# Patient Record
Sex: Female | Born: 1960 | Race: Black or African American | Hispanic: No | Marital: Single | Smoking: Never smoker
Health system: Southern US, Community
[De-identification: ages and names within clinical notes are randomized; demographics above are authoritative.]

## PROBLEM LIST (undated history)

## (undated) DIAGNOSIS — I471 Supraventricular tachycardia, unspecified: Secondary | ICD-10-CM

## (undated) DIAGNOSIS — M1990 Unspecified osteoarthritis, unspecified site: Secondary | ICD-10-CM

## (undated) DIAGNOSIS — I10 Essential (primary) hypertension: Secondary | ICD-10-CM

## (undated) HISTORY — DX: Supraventricular tachycardia: I47.1

## (undated) HISTORY — PX: KNEE SURGERY: SHX244

## (undated) HISTORY — DX: Supraventricular tachycardia, unspecified: I47.10

## (undated) HISTORY — DX: Essential (primary) hypertension: I10

---

## 2005-08-31 ENCOUNTER — Emergency Department (HOSPITAL_COMMUNITY): Admission: EM | Admit: 2005-08-31 | Discharge: 2005-08-31 | Payer: Self-pay | Admitting: Emergency Medicine

## 2005-10-07 ENCOUNTER — Ambulatory Visit: Payer: Self-pay | Admitting: Internal Medicine

## 2005-11-05 ENCOUNTER — Ambulatory Visit: Payer: Self-pay | Admitting: Internal Medicine

## 2005-11-09 ENCOUNTER — Ambulatory Visit: Payer: Self-pay | Admitting: Internal Medicine

## 2005-11-17 ENCOUNTER — Ambulatory Visit: Payer: Self-pay | Admitting: Endocrinology

## 2005-11-17 ENCOUNTER — Ambulatory Visit: Payer: Self-pay | Admitting: Internal Medicine

## 2005-12-02 ENCOUNTER — Ambulatory Visit: Payer: Self-pay | Admitting: Internal Medicine

## 2005-12-21 ENCOUNTER — Ambulatory Visit: Payer: Self-pay | Admitting: Internal Medicine

## 2006-01-12 ENCOUNTER — Encounter: Admission: RE | Admit: 2006-01-12 | Discharge: 2006-01-12 | Payer: Self-pay | Admitting: Internal Medicine

## 2006-02-17 ENCOUNTER — Ambulatory Visit: Payer: Self-pay | Admitting: Internal Medicine

## 2006-03-17 ENCOUNTER — Ambulatory Visit: Payer: Self-pay | Admitting: Internal Medicine

## 2007-03-10 ENCOUNTER — Encounter: Payer: Self-pay | Admitting: *Deleted

## 2007-03-10 DIAGNOSIS — I10 Essential (primary) hypertension: Secondary | ICD-10-CM | POA: Insufficient documentation

## 2007-03-10 DIAGNOSIS — E669 Obesity, unspecified: Secondary | ICD-10-CM | POA: Insufficient documentation

## 2008-04-01 ENCOUNTER — Ambulatory Visit: Payer: Self-pay | Admitting: Internal Medicine

## 2008-04-03 ENCOUNTER — Ambulatory Visit: Payer: Self-pay

## 2008-04-03 ENCOUNTER — Encounter: Payer: Self-pay | Admitting: Internal Medicine

## 2008-04-04 ENCOUNTER — Ambulatory Visit: Payer: Self-pay

## 2009-05-20 ENCOUNTER — Encounter: Admission: RE | Admit: 2009-05-20 | Discharge: 2009-05-20 | Payer: Self-pay | Admitting: Internal Medicine

## 2010-02-11 ENCOUNTER — Telehealth: Payer: Self-pay | Admitting: Internal Medicine

## 2010-02-11 ENCOUNTER — Emergency Department (HOSPITAL_COMMUNITY): Admission: EM | Admit: 2010-02-11 | Discharge: 2010-02-11 | Payer: Self-pay | Admitting: Emergency Medicine

## 2010-02-26 ENCOUNTER — Ambulatory Visit: Payer: Self-pay | Admitting: Internal Medicine

## 2010-02-26 DIAGNOSIS — I471 Supraventricular tachycardia: Secondary | ICD-10-CM | POA: Insufficient documentation

## 2010-07-06 ENCOUNTER — Other Ambulatory Visit: Payer: Self-pay | Admitting: Internal Medicine

## 2010-07-06 DIAGNOSIS — Z1239 Encounter for other screening for malignant neoplasm of breast: Secondary | ICD-10-CM

## 2010-07-09 ENCOUNTER — Ambulatory Visit: Payer: Self-pay

## 2010-07-09 NOTE — Assessment & Plan Note (Signed)
Summary: eph/tia  Medications Added CARDIZEM 120 MG TABS (DILTIAZEM HCL) two times a day      Allergies Added: NKDA  Visit Type:  Follow-up Primary Provider:  Dondra Spry DO   History of Present Illness: Nicole Frazier is seen following another episode of SVT and is referred from the emergency room.  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 She also is modestly hypertensive.  She had another episode last week again which terminated with adenosine.  Symptoms associated with it include shortness of breath and frogpositive. There is no associated chest pain.  she does not snore or have daytime somnolence.  Hospital records were reviewed     Current Medications (verified): 1)  None  Allergies (verified): No Known Drug Allergies  Past History:  Past Medical History: Last updated: 03/10/2007 Hypertension  Vital Signs:  Patient profile:   50 year old female Height:      66 inches Weight:      284 pounds BMI:     46.00 Pulse rate:   64 / minute BP sitting:   160 / 112  (left arm)  Vitals Entered By: Laurance Flatten CMA (February 26, 2010 4:26 PM)  Physical Exam  General:  The patient was alert and oriented in no acute distress. HEENT Normal.  Neck veins were flat, carotids were brisk.  Lungs were clear.  Heart sounds were regular without murmurs or gallops.  Abdomen was soft with active bowel sounds. There is no clubbing cyanosis or edema. 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 Treatment options would include catheter ablation p.r.n. medication ordered daily medication. Given her significant hypertension we will begin her on a calcium  blocker. I reviewed with her the potential side effects. She will be seeing Dr. Cathie Hoops next week who can further up titrated medications as her heart rate allows.  Problem # 2:  HYPERTENSION (ICD-401.9)  as above will begin her on Cardizem. Will start on 120 b.i.d. as I'm concerned about whether her heart rate will tolerate 240.  The following medications were removed from the medication list:    Benazepril-hydrochlorothiazide 20-12.5 Mg Tabs (Benazepril-hydrochlorothiazide) .Marland Kitchen... Take 1 by mouth qd    Cardizem Cd 240 Mg Cp24 (Diltiazem hcl coated beads) .Marland Kitchen... Take 1 by mouth qd Her updated medication list for this problem includes:    Cardizem 120 Mg Tabs (Diltiazem hcl) .Marland Kitchen..Marland Kitchen Two times a day  Patient Instructions: 1)  Your physician has recommended you make the following change in your medication: Cardizem 120mg . one pill in the morning and one in the evening. 2)  Your physician wants you to follow-up in: 1 year  You will receive a reminder letter in the mail two months in advance. If you don't receive a letter, please call our office to schedule the follow-up appointment. Prescriptions: CARDIZEM 120 MG TABS (DILTIAZEM HCL) two times a day  #60 x 3   Entered by:   Claris Gladden RN   Authorized by:   Nathen May, MD, [facility]   Signed by:   Claris Gladden RN on 02/26/2010   Method used:   Electronically to  [facility] [address] [phone]* (retail)       [address]       [place], [place]  [address]       Ph: [phone] or [phone]       Fax: [phone]   RxID:   [id number]

## 2010-07-09 NOTE — Progress Notes (Signed)
Summary: re appt this week   Phone Note Call from Patient   Caller: Patient Reason for Call: Talk to Nurse Summary of Call: pt being dc from hospital now for svt-said hospital wants her to be seen this week with dr Karene Fry this something that has to be seen this week?-pls call [phone] Initial call taken by: Glynda Jaeger,  February 11, 2010 9:35 AM  Follow-up for Phone Call        [facility] Scherrie Bateman, LPN  February 11, 2010 5:07 PM  [facility] Scherrie Bateman, LPN  February 17, 2010 10:46 AM   Additional Follow-up for Phone Call Additional follow up Details #1::        pt returned call-pls call Glynda Jaeger  February 17, 2010 3:18 PM    Additional Follow-up for Phone Call Additional follow up Details #2::    spoke w/pt and she stated she had another SVT episode last Tuesday and Wed at [facility]. See an ED visit on 9/7. Transferred her to make an appointment to discuss w/Dr. Graciela Husbands. Claris Gladden, RN

## 2010-08-20 LAB — POCT CARDIAC MARKERS
CKMB, poc: <1 ng/mL — ABNORMAL LOW (ref 1.0–8.0)
Myoglobin, poc: 39.9 ng/mL (ref 12–200)
Myoglobin, poc: 41.7 ng/mL (ref 12–200)
Troponin i, poc: <0.05 ng/mL (ref 0.00–0.09)

## 2010-08-20 LAB — BASIC METABOLIC PANEL
Calcium: 9 mg/dL (ref 8.4–10.5)
Creatinine, Ser: 0.88 mg/dL (ref 0.4–1.2)
GFR calc Af Amer: >60 mL/min (ref >60)
Sodium: 137 mEq/L (ref 135–145)

## 2010-08-20 LAB — DIFFERENTIAL
Lymphocytes Relative: 31 % (ref 12–46)
Lymphs Abs: 3.8 10*3/uL (ref 0.7–4.0)
Neutro Abs: 6.5 10*3/uL (ref 1.7–7.7)
Neutrophils Relative %: 54 % (ref 43–77)

## 2010-08-20 LAB — CBC
Hemoglobin: 12.5 g/dL (ref 12.0–15.0)
Platelets: 489 10*3/uL — ABNORMAL HIGH (ref 150–400)
RBC: 5.19 MIL/uL — ABNORMAL HIGH (ref 3.87–5.11)
WBC: 12 10*3/uL — ABNORMAL HIGH (ref 4.0–10.5)

## 2010-08-20 LAB — POCT I-STAT, CHEM 8
BUN: 9 mg/dL (ref 6–23)
Chloride: 105 mEq/L (ref 96–112)
Creatinine, Ser: 0.8 mg/dL (ref 0.4–1.2)
Hemoglobin: 14.6 g/dL (ref 12.0–15.0)
Potassium: 4.3 mEq/L (ref 3.5–5.1)
Sodium: 139 mEq/L (ref 135–145)

## 2010-09-08 ENCOUNTER — Other Ambulatory Visit: Payer: Self-pay | Admitting: Internal Medicine

## 2010-10-20 NOTE — Letter (Signed)
April 01, 2008    Nicole Frazier. Nicole Boxer, MD  [facility]  [address]  [place], [place] [address]   [name], [name]  MRN:  [id number]  /  DOB:  [birth year]   Dear Nicole Frazier,   Nicole Frazier was seen today at your request to establish cardiac clearance  for knee replacement procedure.   As you know, she is a 50 year old woman who has prior knee surgery with  long-standing history of poorly controlled high blood pressure, which  extends back at least a couple of years and has an MAR that includes  Metformin, although she does not carry a diagnosis of diabetes and for  which no hemoglobin A1c chart available.   I have met her a couple of years ago when she presented with adenosine  responsive FROG positive supraventricular tachycardia and that has been  quiet in the interim.   Her exercise tolerance is largely limited by her knee.  She used to get  short of breath climbing a flight of stairs.  She does not have  orthopnea, nocturnal dyspnea, or peripheral edema.  She has had no  syncope.   She also denies exertional chest discomfort or exertional upper  extremity discomfort.  She denies palpitations (__________).   PAST MEDICAL HISTORY:  Notable for;  1. Anemia, iron deficiency for which she is taking iron.  2. Arthritis.  3. Hypertension.   REVIEW OF SYSTEMS:  Relatively negative except for the above, apart from  glasses.  Her lipid status is quite good with a mildly low HDL, but the  LDL of 100, and triglyceride level up to 100.   FAMILY HISTORY:  Notable for the absence of diabetes.   SOCIAL HISTORY:  She reports she has 1 son with whom she lives.  He  snores, but she does not think that she does.  She does not use  cigarettes, alcohol or recreational drugs.   MEDICATIONS:  Currently are none.   ALLERGIES:  No known drug allergies.   PHYSICAL EXAMINATION:  GENERAL:  She is a middle-aged, African American  female, who appears her stated age of 63.  VITAL SIGNS:  Her blood pressure is 146/108, her pulse is 94, her weight  is 306, which is relatively stable over the last 2 years.  HEENT:  Demonstrated no icterus or xanthomata.  NECK:  Her neck veins are flat. The carotids are brisk and full  bilaterally without bruits.  BACK:  Without kyphosis or scoliosis.  LUNGS:  Clear.  HEART:  Sounds are irregular, which she was unaware.  Early systolic  murmur was noted along the left sternal border.  ABDOMEN:  Soft with active bowel sounds, but no midline pulsation.  EXTREMITIES:  Femoral pulses are 2+.  Distal pulses are intact. There is  no clubbing, cyanosis, or edema.  NEUROLOGIC:  Grossly normal.  SKIN:  Warm and dry.   Electrocardiogram today demonstrated no acute changes.  I have misplaced  that the heart rate was 95 and there were occasional PVCs on it, as I  said,that she did not sense.   IMPRESSION:  1. Impending knee surgery.  2. 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  3. Hypertension - poorly controlled, on no medication.  4. Obesity.  Body habitus suggestive, but no symptoms of obstructive      sleep apnea.  5. Modest dyspnea on exertion.   Nicole Frazier has got significant hypertension  and this unresolved issue  about diabetes and some exercise limitation as we are getting ready for  her knee surgery.   I would recommend for them to take Myoview scanning to look at both left  ventricular function and myocardial perfusion to hopefully minimize any  periprocedural risks.   MEDICATIONS:  I have given her a prescription for atenolol 50 mg a day  to take 5 days prior to her surgery.  She is to take it also 10 days if  she goes back on her oral therapy and beta-blocker to be used  perioperatively to minimize the recurrence of her supraventricular  tachycardia.   Please let us know when she is in the hospital.  We plan to follow along  with you.     Sincerely,      Duke Salvia, MD, [facility]  Electronically Signed    SCK/MedQ  DD: 04/01/2008  DT: 04/02/2008  Job #: [id number]

## 2010-12-08 ENCOUNTER — Inpatient Hospital Stay (INDEPENDENT_AMBULATORY_CARE_PROVIDER_SITE_OTHER)
Admission: RE | Admit: 2010-12-08 | Discharge: 2010-12-08 | Disposition: A | Discharge status: Home / Self Care | Payer: BC Managed Care – PPO | Source: Ambulatory Visit | Attending: Family Medicine | Admitting: Family Medicine

## 2010-12-08 DIAGNOSIS — M79609 Pain in unspecified limb: Secondary | ICD-10-CM

## 2011-03-22 ENCOUNTER — Encounter: Payer: Self-pay | Admitting: Internal Medicine

## 2011-03-23 ENCOUNTER — Ambulatory Visit: Payer: BC Managed Care – PPO | Admitting: Internal Medicine

## 2011-04-15 ENCOUNTER — Emergency Department (HOSPITAL_COMMUNITY): Payer: BC Managed Care – PPO

## 2011-04-15 ENCOUNTER — Other Ambulatory Visit: Payer: Self-pay

## 2011-04-15 ENCOUNTER — Emergency Department (HOSPITAL_COMMUNITY)
Admission: EM | Admit: 2011-04-15 | Discharge: 2011-04-15 | Disposition: A | Discharge status: Home / Self Care | Payer: BC Managed Care – PPO | Attending: Emergency Medicine | Admitting: Emergency Medicine

## 2011-04-15 ENCOUNTER — Encounter (HOSPITAL_COMMUNITY): Payer: Self-pay | Admitting: Emergency Medicine

## 2011-04-15 DIAGNOSIS — I498 Other specified cardiac arrhythmias: Secondary | ICD-10-CM | POA: Insufficient documentation

## 2011-04-15 DIAGNOSIS — I471 Supraventricular tachycardia: Secondary | ICD-10-CM

## 2011-04-15 DIAGNOSIS — R079 Chest pain, unspecified: Secondary | ICD-10-CM | POA: Insufficient documentation

## 2011-04-15 DIAGNOSIS — R002 Palpitations: Secondary | ICD-10-CM | POA: Insufficient documentation

## 2011-04-15 LAB — CBC
HCT: 42.3 % (ref 36.0–46.0)
Hemoglobin: 13.7 g/dL (ref 12.0–15.0)
MCH: 26 pg (ref 26.0–34.0)
MCHC: 32.4 g/dL (ref 30.0–36.0)
MCV: 80.4 fL (ref 78.0–100.0)
Platelets: 352 10*3/uL (ref 150–400)
RBC: 5.26 MIL/uL — ABNORMAL HIGH (ref 3.87–5.11)
RDW: 14.7 % (ref 11.5–15.5)
WBC: 8 10*3/uL (ref 4.0–10.5)

## 2011-04-15 LAB — CALCIUM, IONIZED: Calcium, Ion: 1.2 mmol/L (ref 1.12–1.32)

## 2011-04-15 LAB — BASIC METABOLIC PANEL
BUN: 12 mg/dL (ref 6–23)
CO2: 24 mEq/L (ref 19–32)
Calcium: 9.1 mg/dL (ref 8.4–10.5)
Chloride: 103 mEq/L (ref 96–112)
Creatinine, Ser: 0.7 mg/dL (ref 0.50–1.10)
GFR calc Af Amer: >90 mL/min (ref >90)
GFR calc non Af Amer: >90 mL/min (ref >90)
Glucose, Bld: 91 mg/dL (ref 70–99)
Potassium: 3.6 mEq/L (ref 3.5–5.1)
Sodium: 138 mEq/L (ref 135–145)

## 2011-04-15 LAB — MAGNESIUM: Magnesium: 1.9 mg/dL (ref 1.5–2.5)

## 2011-04-15 LAB — TROPONIN I: Troponin I: <0.3 ng/mL (ref <0.30)

## 2011-04-15 MED ORDER — ADENOSINE 6 MG/2ML IV SOLN
INTRAVENOUS | Status: AC
  Administered 2011-04-15: 6 mg via INTRAVENOUS
  Filled 2011-04-15: qty 6

## 2011-04-15 MED ORDER — ADENOSINE 6 MG/2ML IV SOLN: 6.0000 mg | Freq: Once | INTRAVENOUS | Status: DC

## 2011-04-15 MED ORDER — ADENOSINE 6 MG/2ML IV SOLN: 6.0000 mg | Freq: Once | INTRAVENOUS | Status: AC

## 2011-04-15 MED ORDER — SODIUM CHLORIDE 0.9 % IV BOLUS (SEPSIS)
1000.0000 mL | Freq: Once | INTRAVENOUS | Status: AC
  Administered 2011-04-15: 1000 mL via INTRAVENOUS

## 2011-04-15 NOTE — ED Notes (Signed)
Urine collected from patient. Was told by dr. Juleen China urine sample not needed.

## 2011-04-15 NOTE — ED Notes (Signed)
Felt her heart started to beat fast at 6 am  No n/v/sob

## 2011-04-15 NOTE — ED Notes (Signed)
Dr. Juleen China aware of patients elevated heart rate and blood pressure.

## 2011-04-15 NOTE — ED Notes (Addendum)
ekg done and given to dr. Juleen China.

## 2011-04-15 NOTE — ED Provider Notes (Signed)
History     50yF with palpitations. Onset around 0600 today. Feels like heart is racing. No CP or SOB. No dizziness, lightheadedness or nausea. No fever or chills. Denies hx of blood clot. No unusual swelling or leg pain. Reports compliance with medications. Hx of SVT with last episode about 2y ago. Followed by Graciela Husbands, cardiology.   CSN: [id number] Arrival date & time: 04/15/2011  7:39 AM   First MD Initiated Contact with Patient 04/15/11 0759      Chief Complaint  Patient presents with  . Chest Pain    (Consider location/radiation/quality/duration/timing/severity/associated sxs/prior treatment) HPI  Past Medical History  Diagnosis Date  . Hypertension   . SVT (supraventricular tachycardia)     recurrent    Past Surgical History  Procedure Date  . Knee surgery     Family History  Problem Relation Age of Onset  . Diabetes Neg Hx     History  Substance Use Topics  . Smoking status: Never Smoker   . Smokeless tobacco: Never Used  . Alcohol Use: No    OB History    Grav Para Term Preterm Abortions TAB SAB Ect Mult Living                  Review of Systems   Review of symptoms negative unless otherwise noted in HPI.   Allergies  Review of patient's allergies indicates no known allergies.  Home Medications   Current Outpatient Rx  Name Route Sig Dispense Refill  . DILTIAZEM HCL 120 MG PO TABS  TAKE 1 TABLET BY MOUTH TWICE DAILY 60 tablet 2    BP 166/124  Temp 98.3 F (36.8 C)  Resp 20  SpO2 98%  Physical Exam  Nursing note and vitals reviewed. Constitutional: She appears well-developed and well-nourished. No distress.  HENT:  Head: Normocephalic and atraumatic.  Eyes: Conjunctivae are normal. Right eye exhibits no discharge. Left eye exhibits no discharge.  Neck: Neck supple.  Cardiovascular: Normal heart sounds.  Exam reveals no gallop and no friction rub.   No murmur heard.      Markedly tachycardic  Pulmonary/Chest: Effort normal and breath  sounds normal. No respiratory distress.  Abdominal: Soft. She exhibits no distension. There is no tenderness.  Musculoskeletal: She exhibits no edema and no tenderness.  Neurological: She is alert.  Skin: Skin is warm and dry. She is not diaphoretic.  Psychiatric: She has a normal mood and affect. Her behavior is normal. Thought content normal.    ED Course  Procedures (including critical care time)  Labs Reviewed  CBC - Abnormal; Notable for the following:    RBC 5.26 (*)    All other components within normal limits  MAGNESIUM  BASIC METABOLIC PANEL  TROPONIN I  CALCIUM, IONIZED    Dg Chest 2 View  04/15/2011  *RADIOLOGY REPORT*  Clinical Data: Tachycardia.  CHEST - 2 VIEW  Comparison: None.  Findings: Two views of the chest demonstrate clear lungs.  Trachea is midline.  Heart and mediastinum are within normal limits.  Bony structures are intact.  IMPRESSION: Normal chest examination.  Original Report Authenticated By: Richarda Overlie, M.D.    No results found.  EKG:  Rhythm: narrow complex tachycardia, likely SVT. No flutter waves appreciated. Rate: 149 Axis: Left Intervals: indeterminant ST segments: NS ST changes   8:08 AM EKG: Repeat after adenosine Rhythm: normal sinus  Axis: L axis Rate: 96 Intervals: first degree AV block, LVH ST segments: NS ST changes Comparison: No significant changes  from previous EKG 02/2010   9:24 AM Pt reassessed. Remains with no complaints. Sinus rhythm on monitor in 80s. Cardiology paged to discuss. Anticipate DC home with cards f/u.  1. SVT (supraventricular tachycardia)       MDM  50yF with SVT. Conversion to normal sinus after adenosine. Hx of same. W/u unremarkable. Pt has remained in normal sinus since cardioversion and remains with no complaints. Outpt f/u with cardiology.        Raeford Razor, MD 04/15/11 [phone]

## 2011-04-15 NOTE — ED Notes (Signed)
bp-159/102

## 2011-04-27 ENCOUNTER — Encounter: Payer: Self-pay | Admitting: Internal Medicine

## 2011-04-27 ENCOUNTER — Ambulatory Visit (INDEPENDENT_AMBULATORY_CARE_PROVIDER_SITE_OTHER): Payer: BC Managed Care – PPO | Admitting: Internal Medicine

## 2011-04-27 VITALS — BP 182/112 | HR 68 | Ht 66.0 in | Wt 300.4 lb

## 2011-04-27 DIAGNOSIS — I10 Essential (primary) hypertension: Secondary | ICD-10-CM

## 2011-04-27 DIAGNOSIS — I498 Other specified cardiac arrhythmias: Secondary | ICD-10-CM

## 2011-04-27 DIAGNOSIS — I471 Supraventricular tachycardia: Secondary | ICD-10-CM

## 2011-04-27 MED ORDER — DILTIAZEM HCL 120 MG PO TABS: 120.0000 mg | ORAL_TABLET | Freq: Two times a day (BID) | ORAL | Status: DC

## 2011-04-27 NOTE — Patient Instructions (Signed)
We will see you back on an as needed basis.  Your physician recommends that you continue on your current medications as directed. Please refer to the Current Medication list given to you today.  

## 2011-04-27 NOTE — Assessment & Plan Note (Signed)
Blood pressure is out of control. She has not been taking her medications. We had a long discussion regarding the importance of antihypertensive therapy. We also discussed his relationship with obesity which is a lifelong struggles for her. We'll resume diltiazem. Will also use adjunct with rhythm control.

## 2011-04-27 NOTE — Assessment & Plan Note (Signed)
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 We reviewed ablation with potential risks and benefits. This point she would like to hold off and pursue medical therapy.

## 2011-04-27 NOTE — Progress Notes (Signed)
  HPI  Nicole Frazier is a 50 y.o. female is seen following another episode of SVT and is referred from the emergency room.  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 She had another episode 2 weeks or so ago Electro-cardiograms from [facility] were reviewed. They are consistent also with AV nodal reentry.  She also had positive hypertension. At her last visit we put her on a calcium blocker with expected up titration..   She ran out of 'Dilt a few months ago and has not been renewed      Past Medical History  Diagnosis Date  . Hypertension   . SVT (supraventricular tachycardia)     recurrent    Past Surgical History  Procedure Date  . Knee surgery     Current Outpatient Prescriptions  Medication Sig Dispense Refill  . diltiazem (CARDIZEM) 120 MG tablet TAKE 1 TABLET BY MOUTH TWICE DAILY  60 tablet  2    No Known Allergies  Review of Systems negative except from HPI and PMH  Physical Exam Well developed and well nourished morbidly obeese in no acute distress HENT normal E scleral and icterus clear Neck Supple JVP flat; carotids brisk and full Clear to ausculation Regular rate and rhythm, no murmurs gallops or rub Soft with active bowel sounds No clubbing cyanosis and edema Alert and oriented, grossly normal motor and sensory function Skin Warm and Dry  ECG  Assessment and  Plan

## 2012-10-04 IMAGING — CR DG CHEST 2V
2 series · 2 of 2 positions shown · non-contrast
Comparison: None.

CLINICAL DATA: Tachycardia.

CHEST - 2 VIEW

[w chest pa]
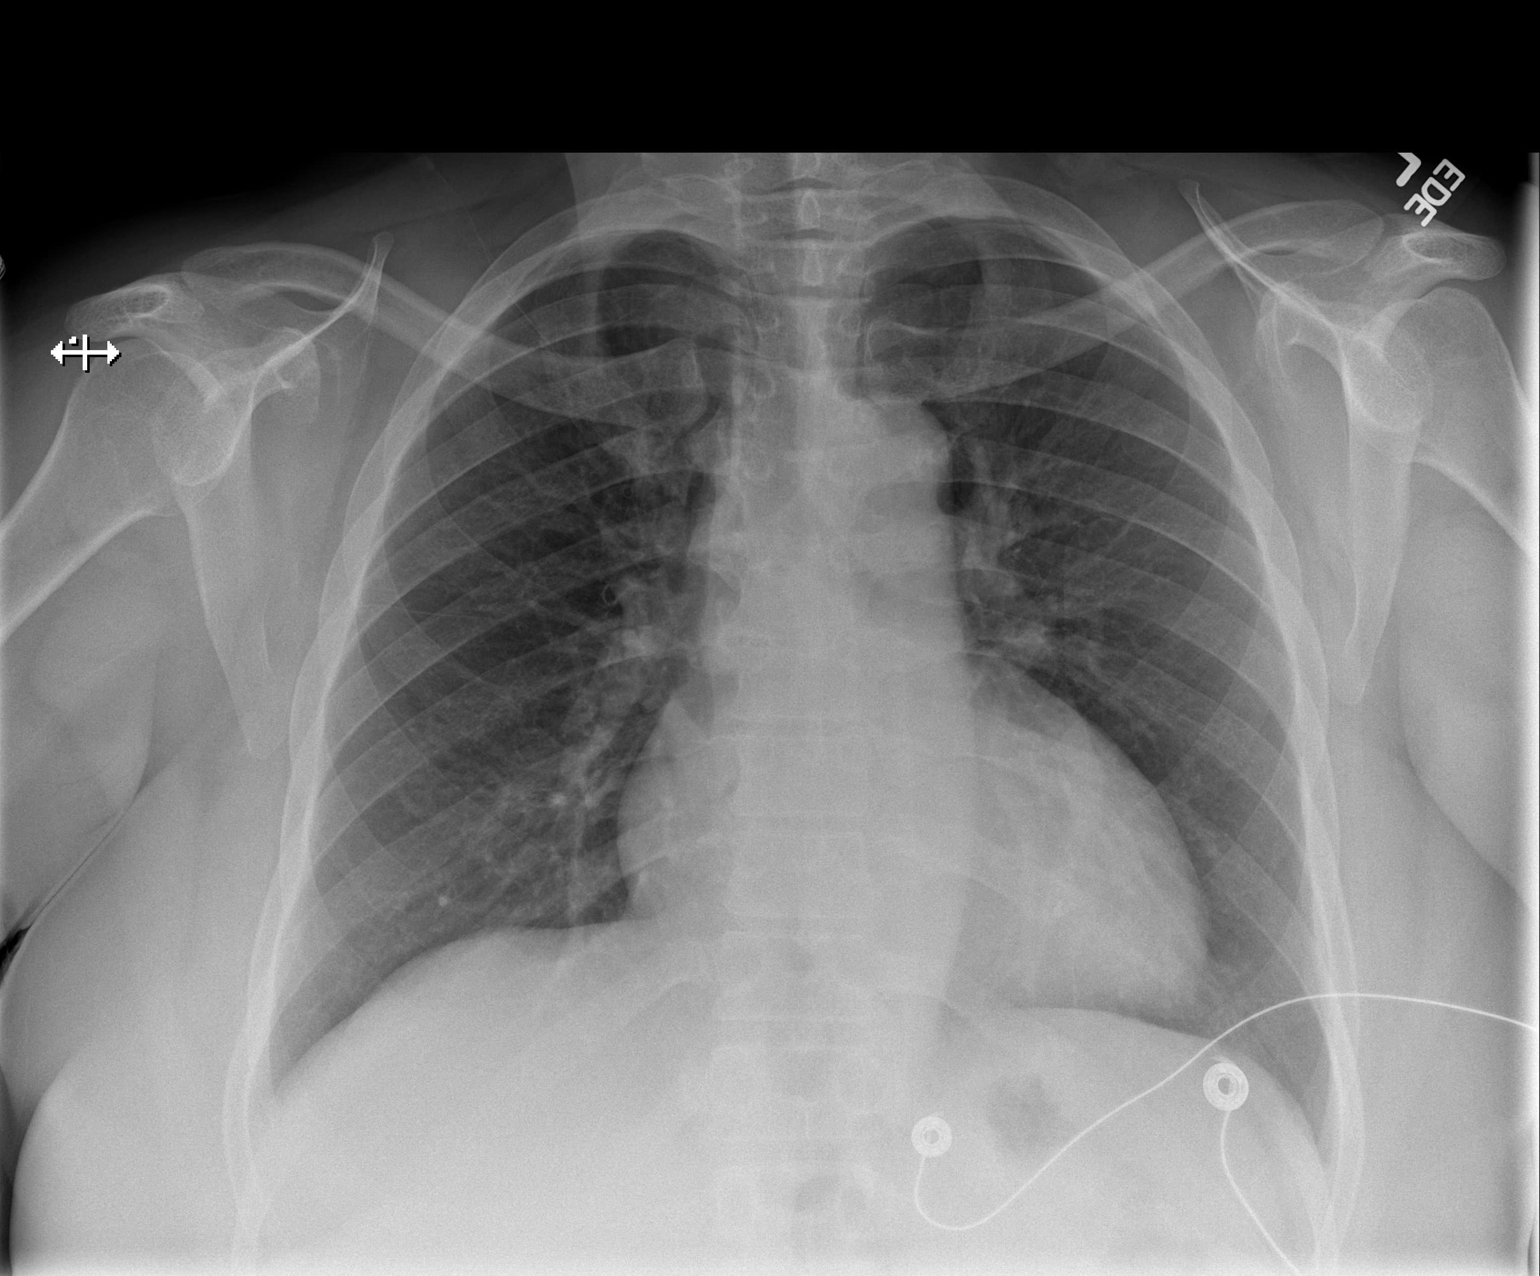

[w chest lat]
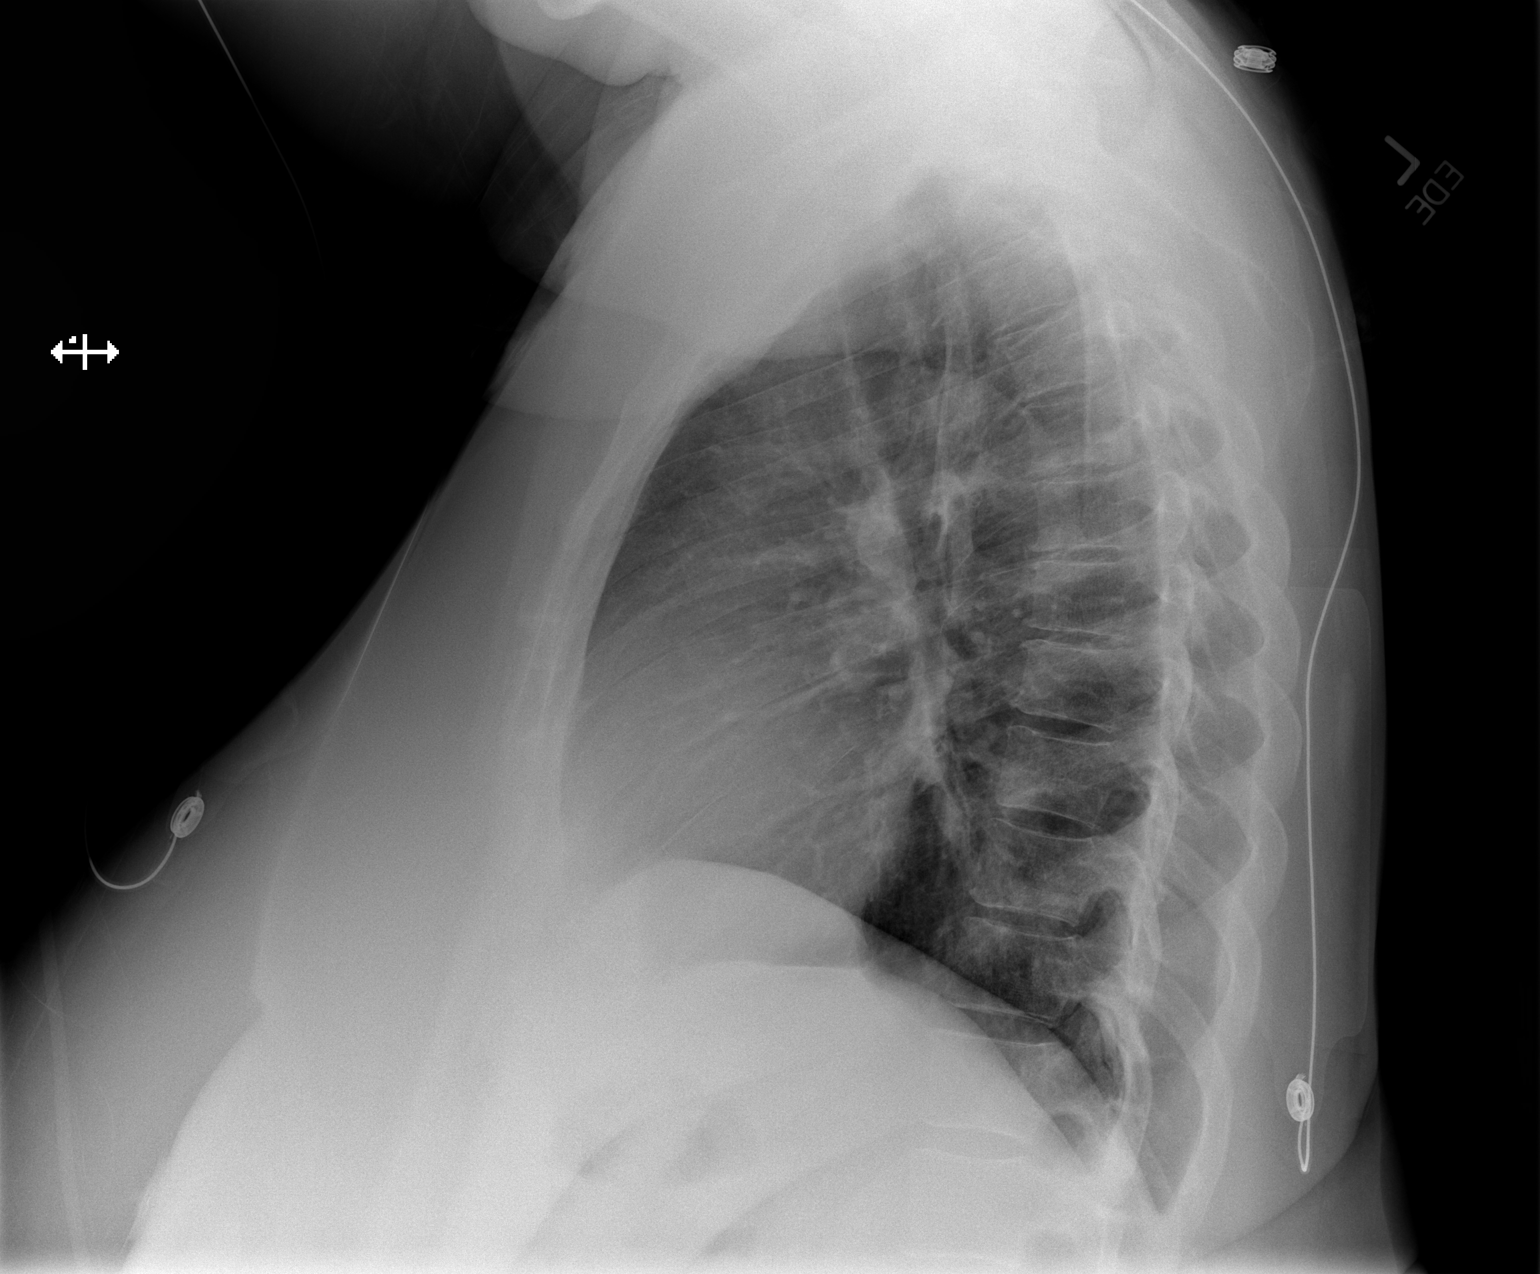

[2 of 2 positions shown; findings below may reference images not displayed]

FINDINGS: Two views of the chest demonstrate clear lungs.  Trachea
is midline.  Heart and mediastinum are within normal limits.  Bony
structures are intact.
IMPRESSION: Normal chest examination.

## 2013-04-11 ENCOUNTER — Ambulatory Visit: Payer: Self-pay

## 2014-04-07 ENCOUNTER — Encounter (HOSPITAL_COMMUNITY): Payer: Self-pay | Admitting: Family Medicine

## 2014-04-07 ENCOUNTER — Emergency Department (HOSPITAL_COMMUNITY)
Admission: EM | Admit: 2014-04-07 | Discharge: 2014-04-07 | Disposition: A | Discharge status: Home / Self Care | Payer: PRIVATE HEALTH INSURANCE | Attending: Emergency Medicine | Admitting: Emergency Medicine

## 2014-04-07 DIAGNOSIS — I471 Supraventricular tachycardia: Secondary | ICD-10-CM

## 2014-04-07 DIAGNOSIS — Z79899 Other long term (current) drug therapy: Secondary | ICD-10-CM | POA: Insufficient documentation

## 2014-04-07 DIAGNOSIS — E669 Obesity, unspecified: Secondary | ICD-10-CM | POA: Insufficient documentation

## 2014-04-07 DIAGNOSIS — R000 Tachycardia, unspecified: Secondary | ICD-10-CM | POA: Diagnosis present

## 2014-04-07 DIAGNOSIS — I10 Essential (primary) hypertension: Secondary | ICD-10-CM | POA: Diagnosis not present

## 2014-04-07 LAB — CBC WITH DIFFERENTIAL/PLATELET
BASOS ABS: 0 10*3/uL (ref 0.0–0.1)
BASOS PCT: 0 % (ref 0–1)
Eosinophils Absolute: 0.1 10*3/uL (ref 0.0–0.7)
Eosinophils Relative: 1 % (ref 0–5)
HEMATOCRIT: 40 % (ref 36.0–46.0)
Hemoglobin: 13.1 g/dL (ref 12.0–15.0)
LYMPHS PCT: 27 % (ref 12–46)
Lymphs Abs: 3.5 10*3/uL (ref 0.7–4.0)
MCH: 26.8 pg (ref 26.0–34.0)
MCHC: 32.8 g/dL (ref 30.0–36.0)
MCV: 81.8 fL (ref 78.0–100.0)
Monocytes Absolute: 1.4 10*3/uL — ABNORMAL HIGH (ref 0.1–1.0)
Monocytes Relative: 11 % (ref 3–12)
NEUTROS ABS: 7.7 10*3/uL (ref 1.7–7.7)
NEUTROS PCT: 61 % (ref 43–77)
PLATELETS: 421 10*3/uL — AB (ref 150–400)
RBC: 4.89 MIL/uL (ref 3.87–5.11)
RDW: 14 % (ref 11.5–15.5)
WBC: 12.7 10*3/uL — AB (ref 4.0–10.5)

## 2014-04-07 LAB — BASIC METABOLIC PANEL
ANION GAP: 14 (ref 5–15)
BUN: 9 mg/dL (ref 6–23)
CHLORIDE: 102 meq/L (ref 96–112)
CO2: 25 meq/L (ref 19–32)
CREATININE: 0.85 mg/dL (ref 0.50–1.10)
Calcium: 9.5 mg/dL (ref 8.4–10.5)
GFR calc non Af Amer: 77 mL/min — ABNORMAL LOW (ref >90)
GFR, EST AFRICAN AMERICAN: 89 mL/min — AB (ref >90)
Glucose, Bld: 126 mg/dL — ABNORMAL HIGH (ref 70–99)
POTASSIUM: 4 meq/L (ref 3.7–5.3)
SODIUM: 141 meq/L (ref 137–147)

## 2014-04-07 LAB — I-STAT TROPONIN, ED: Troponin i, poc: 0.01 ng/mL (ref 0.00–0.08)

## 2014-04-07 MED ORDER — ADENOSINE 6 MG/2ML IV SOLN
INTRAVENOUS | Status: AC
  Filled 2014-04-07: qty 2

## 2014-04-07 MED ORDER — ADENOSINE 6 MG/2ML IV SOLN: 6.0000 mg | Freq: Once | INTRAVENOUS | Status: DC

## 2014-04-07 NOTE — Discharge Instructions (Signed)
Supraventricular Tachycardia °Supraventricular tachycardia (SVT) is an abnormal heart rhythm (arrhythmia) that causes the heart to beat very fast (tachycardia). This kind of fast heartbeat originates in the upper chambers of the heart (atria). SVT can cause the heart to beat greater than 100 beats per minute. SVT can have a rapid burst of heartbeats. This can start and stop suddenly without warning and is called nonsustained. SVT can also be sustained, in which the heart beats at a continuous fast rate.  °CAUSES  °There can be different causes of SVT. Some of these include: °· Heart valve problems such as mitral valve prolapse. °· An enlarged heart (hypertrophic cardiomyopathy). °· Congenital heart problems. °· Heart inflammation (pericarditis). °· Hyperthyroidism. °· Low potassium or magnesium levels. °· Caffeine. °· Drug use such as cocaine, methamphetamines, or stimulants. °· Some over-the-counter medicines such as: °¨ Decongestants. °¨ Diet medicines. °¨ Herbal medicines. °SYMPTOMS  °Symptoms of SVT can vary. Symptoms depend on whether the SVT is sustained or nonsustained. You may experience: °· No symptoms (asymptomatic). °· An awareness of your heart beating rapidly (palpitations). °· Shortness of breath. °· Chest pain or pressure. °If your blood pressure drops because of the SVT, you may experience: °· Fainting or near fainting. °· Weakness. °· Dizziness. °DIAGNOSIS  °Different tests can be performed to diagnose SVT, such as: °· An electrocardiogram (EKG). This is a painless test that records the electrical activity of your heart. °· Holter monitor. This is a 24 hour recording of your heart rhythm. You will be given a diary. Write down all symptoms that you have and what you were doing at the time you experienced symptoms. °· Arrhythmia monitor. This is a small device that your wear for several weeks. It records the heart rhythm when you have symptoms. °· Echocardiogram. This is an imaging test to help detect  abnormal heart structure such as congenital abnormalities, heart valve problems, or heart enlargement. °· Stress test. This test can help determine if the SVT is related to exercise. °· Electrophysiology study (EPS). This is a procedure that evaluates your heart's electrical system and can help your caregiver find the cause of your SVT. °TREATMENT  °Treatment of SVT depends on the symptoms, how often it recurs, and whether there are any underlying heart problems.  °· If symptoms are rare and no other cardiac disease is present, no treatment may be needed. °· Blood work may be done to check potassium, magnesium, and thyroid hormone levels to see if they are abnormal. If these levels are abnormal, treatment to correct the problems will occur. °Medicines °Your caregiver may use oral medicines to treat SVT. These medicines are given for long-term control of SVT. Medicines may be used alone or in combination with other treatments. These medicines work to slow nerve impulses in the heart muscle. These medicines can also be used to treat high blood pressure. Some of these medicines may include: °· Calcium channel blockers. °· Beta blockers. °· Digoxin. °Nonsurgical procedures °Nonsurgical techniques may be used if oral medicines do not work. Some examples include: °· Cardioversion. This technique uses either drugs or an electrical shock to restore a normal heart rhythm. °¨ Cardioversion drugs may be given through an intravenous (IV) line to help "reset" the heart rhythm. °¨ In electrical cardioversion, the caregiver shocks your heart to stop its beat for a split second. This helps to reset the heart to a normal rhythm. °· Ablation. This procedure is done under mild sedation. High frequency radio wave energy is used to   destroy the area of heart tissue responsible for the SVT. °HOME CARE INSTRUCTIONS  °· Do not smoke. °· Only take medicines prescribed by your caregiver. Check with your caregiver before using over-the-counter  medicines. °· Check with your caregiver about how much alcohol and caffeine (coffee, tea, colas, or chocolate) you may have. °· It is very important to keep all follow-up referrals and appointments in order to properly manage this problem. °SEEK IMMEDIATE MEDICAL CARE IF: °· You have dizziness. °· You faint or nearly faint. °· You have shortness of breath. °· You have chest pain or pressure. °· You have sudden nausea or vomiting. °· You have profuse sweating. °· You are concerned about how long your symptoms last. °· You are concerned about the frequency of your SVT episodes. °If you have the above symptoms, call your local emergency services (911 in [place].S[place] immediately. Do not drive yourself to the hospital. °MAKE SURE YOU:  °· Understand these instructions. °· Will watch your condition. °· Will get help right away if you are not doing well or get worse. °Document Released: 05/24/2005 Document Revised: 08/16/2011 Document Reviewed: 09/05/2008 °ExitCare® Patient Information ©2015 [employer]. This information is not intended to replace advice given to you by your health care provider. Make sure you discuss any questions you have with your health care provider. ° ° °Emergency Department Resource Guide °1) Find a Doctor and Pay Out of Pocket °Although you won't have to find out who is covered by your insurance plan, it is a good idea to ask around and get recommendations. You will then need to call the office and see if the doctor you have chosen will accept you as a new patient and what types of options they offer for patients who are self-pay. Some doctors offer discounts or will set up payment plans for their patients who do not have insurance, but you will need to ask so you aren't surprised when you get to your appointment. ° °2) Contact Your Local Health Department °Not all health departments have doctors that can see patients for sick visits, but many do, so it is worth a call to see if yours does. If you don't  know where your local health department is, you can check in your phone book. The CDC also has a tool to help you locate your state's health department, and many state websites also have listings of all of their local health departments. ° °3) Find a Walk-in Clinic °If your illness is not likely to be very severe or complicated, you may want to try a walk in clinic. These are popping up all over the country in pharmacies, drugstores, and shopping centers. They're usually staffed by nurse practitioners or physician assistants that have been trained to treat common illnesses and complaints. They're usually fairly quick and inexpensive. However, if you have serious medical issues or chronic medical problems, these are probably not your best option. ° °No Primary Care Doctor: °- Call [facility] at  832-8000 - they can help you locate a primary care doctor that  accepts your insurance, provides certain services, etc. °- Physician Referral Service- 1-800-533-3463 ° °Chronic Pain Problems: °Organization         Address  Phone   Notes  °[facility]  (336) 297-2271 Patients need to be referred by their primary care doctor.  ° °Medication Assistance: °Organization         Address  Phone   Notes  °[place] [facility]   [address] °[place], [place] [address] (336) 641-8030 --Must be a resident of [place] °-- Must have NO insurance coverage whatsoever (no Medicaid/ Medicare, etc.) °-- The pt. MUST have a primary care doctor that directs their care regularly and follows them in the community °  °[facility]  (866) 331-1348   °[employer]  (888) 892-1162   ° °Agencies that provide inexpensive medical care: °Organization         Address  Phone   Notes  °[facility]  (336) 832-8035   °[facility]    (336) 832-7272   °[facility] [address] °[place], [place] [address] (336) 832-4777   °[facility] [address], °[place] (336) 271-4999   °[facility]    (336) 373-0678   °[facility]    (336) 272-1050   °[facility] ° [address], [place] Phone:  (336) 832-4444, Fax:  (336) 832-4440 Hours of Operation:  9 am - 6 pm, M-F.  Also accepts Medicaid/Medicare and self-pay.  °[facility] ° [address], [place] Phone: (336) 832-3150, Fax: (336) [phone]. Hours of Operation:  8:30 am - 5:30 pm, M-F.  Also accepts Medicaid and self-pay.  °[facility] [place] [address], [place] Phone: (336) 878-6027   °[facility] [address], [place], [place] (336)723-1848, Ext. 123 Mondays & Thursdays: 7-9 AM.  First 15 patients are seen on a first come, first serve basis. °  ° °Medicaid-accepting [place] Providers: ° °Organization         Address  Phone   Notes  °[facility] [address], [place] (336) 641-2100 Also accepts self-pay patients.  °[facility] [address], [place] ° (336) 856-9996   °[facility] [address] [place] [address], [place] (336) 288-8857   °[facility] [address] [place] [address], [place] (336) 299-7000   °Veita Bland [address], [place]  ° (336) 373-1557 Only accepts [place] Access Medicaid patients after they have their name applied to their card.  ° °Self-Pay (no insurance) in [place]: ° °Organization         Address  Phone   Notes  °Sickle Cell Patients, [facility] [address], [place] (336) 832-1970   °[facility] [address], [place] (336) 832-4400   °[facility] ° [address], [place] (336) 992-4800   °[facility]/Dr. Osei-Bonsu ° [address] [place] [address], [place] or [address], [place] (336) [phone] Phone  number for both [place] and [place] locations is the same.  °[facility] [address], [place] (336) 299-0000   °[facility] [address] [place] [address], [place] or [address] (336) 852-7530 °(336) 878-2260   °[facility] [address],  (336) 350-1642, phone; (336) 294-5005, fax Sees patients 1st and 3rd Saturday of every month.  Must not qualify for public or private insurance (i.e. Medicaid, Medicare, [place] Health Choice, Veterans' Benefits) • Household income should be no more than 200% of the poverty level •The clinic cannot treat you if you are pregnant or think you are pregnant • Sexually transmitted diseases are not treated at the clinic.  ° ° °  Dental Care: °Organization         Address  Phone  Notes  °[facility] [address], [place] (336) 641-6152 Accepts children up to age 21 who are enrolled in Medicaid or [place] Health Choice; pregnant women with a Medicaid card; and children who have applied for Medicaid or [place] Health Choice, but were declined, whose parents can pay a reduced fee at time of service.  °[facility] [place]  [address], [place] (336) 641-7733 Accepts children up to age 21 who are enrolled in Medicaid or [place] Health Choice; pregnant women with a Medicaid card; and children who have applied for Medicaid or [place] Health Choice, but were declined, whose parents can pay a reduced fee at time of service.  °[facility] ° [address], [place] (336) 641-4533 Patients are seen by appointment only. Walk-ins are not accepted. [facility] will see patients 18 years of age and older. °Monday - Tuesday (8am-5pm) °Most Wednesdays (8:30-5pm) °$30 per visit, cash only  °[facility] ° [address], [place] (336) [phone] Patients are seen by appointment only.  Walk-ins are not accepted. [facility] will see patients 18 years of age and older. °One Wednesday Evening (Monthly: Volunteer Based).  $30 per visit, cash only  °[facility]  (919) 537-3737 for adults; Children under age 4, call Graduate Pediatric Dentistry at (919) [phone]. Children aged 4-14, please call (919) [phone] to request a pediatric application. ° Dental services are provided in all areas of dental care including fillings, crowns and bridges, complete and partial dentures, implants, gum treatment, root canals, and extractions. Preventive care is also provided. Treatment is provided to both adults and children. °Patients are selected via a lottery and there is often a waiting list. °  °[facility] [address], °[place] ° (336) 763-8833 [email or web address] °  °[facility] [address], [place], [place] (336)723-1848, Ext. 123 Second and Fourth Thursday of each month, opens at 6:30 AM; Clinic ends at 9 AM.  Patients are seen on a first-come first-served basis, and a limited number are seen during each clinic.  ° °[facility] ° [address], [place], [place] (336) 723-7904   Eligibility Requirements °You must have lived in [place], [place], or [place] counties for at least the last three months. °  You cannot be eligible for state or federal sponsored healthcare insurance, including Veterans Administration, Medicaid, or Medicare. °  You generally cannot be eligible for healthcare insurance through your employer.  °  How to apply: °Eligibility screenings are held every Tuesday and Wednesday afternoon from 1:00 pm until 4:00 pm. You do not need an appointment for the interview!  °[facility] [address], [place], [place] 336-631-2330   °[facility]  336-342-8273   °[facility]  336-703-3100   ° [facility]  336-570-6415   ° °Behavioral Health  Resources in the Community: °Intensive Outpatient Programs °Organization         Address  Phone  Notes  °[facility] [address], [place], [place] 336-878-6098   °[facility] [address], [place], [place] 336-832-9800   °[facility] [address], [place], [place] ° 336-882-2125   °[facility] [address],  °  [place], [place] 1-800-853-5163 or 336-641-4981   °Substance Abuse Resources °Organization         Address  Phone  Notes  °[facility]  336-882-2125   °[facility]  336-784-9470   °[facility]  336-285-9073   °[facility]  336-845-3988   °Residential & Outpatient Substance Abuse Program  1-800-659-3381   °Psychological Services °Organization         Address  Phone  Notes  °[facility]  336- 832-9600   °[facility]  336- 378-7881   °[facility] [address], [place] 1-800-853-5163 or 336-641-4981   ° °Mobile Crisis Teams °Organization         Address  Phone  Notes  °[facility], Mobile Crisis Care Unit  1-877-626-1772   °Assertive °[facility] ° [address] [place], [place] 336-834-9664   °Sharon DeEsch [address] °[place] 336-554-5454   ° °Self-Help/Support Groups °Organization         Address  Phone             Notes  °[facility] - variety of support groups  336- 373-1402 Call for more information  °Narcotics Anonymous (NA), [facility] [address], °[place]  2 meetings at this location  ° °Residential Treatment Programs °Organization         Address  Phone  Notes  °[facility] [address],    °[place]  1-866-801-8205   °[facility] ° [address], [place], [place] 704-293-8524   °[facility] [address], [place] 336-845-3988 Admissions: 8am-3pm M-F  °[facility] [address]    °[place], [place] 336-841-1104   °[facility] [address], [place], [place] 336-379-7146   °[facility] [address]  °[place], [place] 336-285-9073   °[facility] Intensive Outpatient [address], [place], [place] 336-852-3033   °[facility] [address]  °[place], [place] 1-877-615-2722 or 336-784-9470   °[facility] [address] [place], [place] 336-227-7417 Accepts Medicaid  °[facility] [address]  ° [place] 1-800-659-3381 Substance Abuse/Addiction Treatment  ° °[place] Behavioral Health Resources °Organization         Address  Phone  Notes  °[facility]  (888) 581-9988   °Julie Brannon, PhD [address] [place], [place]   (336) 349-5553 or (336) 951-0000   °[facility]   [address] °[place], [place] (336) 349-4454   °[facility] [address], [place], [place] (336) 342-8316 Insurance/Medicaid/sponsorship through [facility]  °[facility] [address]                                    [place], [place] (336) 342-8316 Therapy/tele-psych/case  °[facility] [address]  ° [place], [place] (336) 349-2233    °Dr. Arfeen  (336) 349-4544   °[facility] [place]  [employer] [place] [facility] 1) [address], [place] °2) [address], [place] °3)  [address], [place] (336) 349-3220 °(336) 342-7768 ° °(336) 342-8140   °[place] [facility] (336) 342-1394 or (336) 342-3537 (After Hours)    ° ° ° °

## 2014-04-07 NOTE — ED Provider Notes (Signed)
CSN: [id number]     Arrival date & time 04/07/14  0736 History   First MD Initiated Contact with Patient 04/07/14 0745     Chief Complaint  Patient presents with  . Tachycardia    Patient is a 53 y.o. female presenting with palpitations. The history is provided by the patient.  Palpitations Palpitations quality:  Fast (and regular) Onset quality:  Sudden Duration:  2 hours Timing:  Constant Progression:  Unchanged Chronicity:  Recurrent Context: not anxiety, not caffeine and not hyperventilation   Relieved by:  None tried Worsened by:  Nothing tried Ineffective treatments:  None tried Associated symptoms: chest pain and shortness of breath   Associated symptoms: no back pain, no dizziness, no nausea, no near-syncope, no vomiting and no weakness   Chest pain:    Quality:  Aching   Severity:  Mild   Onset quality:  Sudden   Duration:  2 hours   Timing:  Constant   Progression:  Unchanged   Chronicity:  New Shortness of breath:    Severity:  Mild   Onset quality:  Sudden   Duration:  2 hours   Timing:  Constant   Progression:  Unchanged Risk factors: no hx of atrial fibrillation   Risk factors comment:  Hx of recurrent SVT; stopped taking Diltiazem   Past Medical History  Diagnosis Date  . Hypertension   . SVT (supraventricular tachycardia)     recurrent   Past Surgical History  Procedure Laterality Date  . Knee surgery     Family History  Problem Relation Age of Onset  . Diabetes Neg Hx    History  Substance Use Topics  . Smoking status: Never Smoker   . Smokeless tobacco: Never Used  . Alcohol Use: No   OB History    No data available     Review of Systems  Constitutional: Negative for fever and chills.  Respiratory: Positive for shortness of breath.   Cardiovascular: Positive for chest pain and palpitations. Negative for leg swelling and near-syncope.  Gastrointestinal: Negative for nausea, vomiting and abdominal pain.  Musculoskeletal: Negative for  back pain.  Neurological: Negative for dizziness.  All other systems reviewed and are negative.   Allergies  Review of patient's allergies indicates no known allergies.  Home Medications   Prior to Admission medications   Medication Sig Start Date End Date Taking? Authorizing Provider  diltiazem (CARDIZEM) 120 MG tablet Take 1 tablet (120 mg total) by mouth 2 (two) times daily. 04/27/11   Duke SalviaSteven C Klein, MD   BP 161/136 mmHg  Pulse 165  Temp(Src) 98.2 F (36.8 C) (Oral)  Resp 22  SpO2 97%   Physical Exam  Constitutional: She is oriented to person, place, and time. She appears well-developed and well-nourished. She is cooperative. No distress.  Well appearing, obese female, sitting in the stretcher  HENT:  Head: Normocephalic and atraumatic.  Right Ear: External ear normal.  Left Ear: External ear normal.  Nose: Nose normal.  Neck: Normal range of motion and phonation normal.  Cardiovascular: Regular rhythm.  Tachycardia present.   Pulses:      Radial pulses are 2+ on the right side, and 2+ on the left side.  No peripheral edema; HR 150s  Pulmonary/Chest: Effort normal. No respiratory distress. She has no decreased breath sounds. She has no wheezes. She has no rales.  Abdominal: Soft. She exhibits no distension. There is no tenderness. There is no rebound and no guarding.  Neurological: She is alert  and oriented to person, place, and time.  Skin: Skin is warm and dry. No rash noted. She is not diaphoretic.  Psychiatric: She has a normal mood and affect. Her speech is normal.  Nursing note and vitals reviewed.   ED Course  Procedures  Labs Review  Results for orders placed or performed during the hospital encounter of 04/07/14  CBC with Differential  Result Value Ref Range   WBC 12.7 (H) 4.0 - 10.5 K/uL   RBC 4.89 3.87 - 5.11 MIL/uL   Hemoglobin 13.1 12.0 - 15.0 g/dL   HCT 16.1 09.6 - 04.5 %   MCV 81.8 78.0 - 100.0 fL   MCH 26.8 26.0 - 34.0 pg   MCHC 32.8 30.0 -  36.0 g/dL   RDW 40.9 81.1 - 91.4 %   Platelets 421 (H) 150 - 400 K/uL   Neutrophils Relative % 61 43 - 77 %   Neutro Abs 7.7 1.7 - 7.7 K/uL   Lymphocytes Relative 27 12 - 46 %   Lymphs Abs 3.5 0.7 - 4.0 K/uL   Monocytes Relative 11 3 - 12 %   Monocytes Absolute 1.4 (H) 0.1 - 1.0 K/uL   Eosinophils Relative 1 0 - 5 %   Eosinophils Absolute 0.1 0.0 - 0.7 K/uL   Basophils Relative 0 0 - 1 %   Basophils Absolute 0.0 0.0 - 0.1 K/uL  Basic metabolic panel  Result Value Ref Range   Sodium 141 137 - 147 mEq/L   Potassium 4.0 3.7 - 5.3 mEq/L   Chloride 102 96 - 112 mEq/L   CO2 25 19 - 32 mEq/L   Glucose, Bld 126 (H) 70 - 99 mg/dL   BUN 9 6 - 23 mg/dL   Creatinine, Ser 7.82 0.50 - 1.10 mg/dL   Calcium 9.5 8.4 - 95.6 mg/dL   GFR calc non Af Amer 77 (L) >90 mL/min   GFR calc Af Amer 89 (L) >90 mL/min   Anion gap 14 5 - 15  I-stat troponin, ED  Result Value Ref Range   Troponin i, poc 0.01 0.00 - 0.08 ng/mL   Comment 3            1st EKG on arrival: Narrow complex tachycardia; Rate 161, QTc 491, No ST elevation or depression  Repeat EKG after vagal maneuver: Sinus tachycardia; Rate 105, PR 173, QTc 476, No ST elevation or depression  MDM   Final diagnoses:  SVT (supraventricular tachycardia)    53 y.o. female with a history of recurrent SVT presents in SVT. Woke her from sleep this morning. Feels exactly like her prior episodes of SVT before. Has had adenosine before with resultant cardioversion. She has tried vagal maneuvers at home prior to coming to the ED -- this has been successful sometimes in the past.   We had her blow into a syringe for as long as she could and then quickly put her into a trendelenburg position on her stretcher. This resolved her SVT. Rate 104. Repeat EKG obtained. She immediately felt better with resolution of her subjective shortness of breath and chest pain.  Will monitor for recurrence and follow up labs. Will give a normal saline bolus.   10:01  AM Patient continues to feel "much better"; no chest pain or dyspnea; no recurrence of SVT; HR in the 90s  Electrolytes ok. Troponin negative. Hemoglobin wnl.   Stable for discharge home.   I discussed strict return precautions with the patient. She is to follow up with  her Cardiologist. Contact information given in case she was unable to get in with her cardiologist given a recent change in her insurance. Also advised to follow up with a PCP. Resources given.   This case managed in conjunction with my attending, Dr. Jodi MourningZavitz.    Maxine GlennAnn Araina Butrick, MD 04/07/14 1009

## 2014-04-07 NOTE — ED Notes (Signed)
Per pt sts tachycardia that woke her up about 2 hours ago. sts chest pain and SOB. Hx of SVT

## 2014-04-07 NOTE — ED Notes (Signed)
Provider at the bedside. Pt blowing into a syringe with plunger. Pt HR noted at 104 NSR.

## 2014-04-07 NOTE — ED Notes (Addendum)
Provider at the bedside. Pt placed on zoll pads.

## 2014-05-13 ENCOUNTER — Ambulatory Visit: Payer: BC Managed Care – PPO | Admitting: Internal Medicine

## 2014-05-14 ENCOUNTER — Encounter: Payer: Self-pay | Admitting: Internal Medicine

## 2014-08-28 ENCOUNTER — Encounter: Payer: Self-pay | Admitting: Internal Medicine

## 2014-08-28 ENCOUNTER — Ambulatory Visit (INDEPENDENT_AMBULATORY_CARE_PROVIDER_SITE_OTHER): Payer: PRIVATE HEALTH INSURANCE | Admitting: Internal Medicine

## 2014-08-28 VITALS — BP 128/88 | HR 80 | Ht 66.0 in | Wt 318.0 lb

## 2014-08-28 DIAGNOSIS — I471 Supraventricular tachycardia: Secondary | ICD-10-CM | POA: Diagnosis not present

## 2014-08-28 MED ORDER — PROPRANOLOL HCL 10 MG PO TABS: ORAL_TABLET | ORAL | Status: DC

## 2014-08-28 NOTE — Patient Instructions (Addendum)
Your physician has recommended you make the following change in your medication:  1) START Inderal 10 mg as needed for SVT -- you may take one, and if no relief you may take one more 30 minutes after the first.  Do not take more than 2 pills.  Your physician wants you to follow-up in: 2 years with Dr. Graciela HusbandsKlein.  You will receive a reminder letter in the mail two months in advance. If you don't receive a letter, please call our office to schedule the follow-up appointment.

## 2014-08-28 NOTE — Progress Notes (Signed)
ELECTROPHYSIOLOGY CONSULT NOTE  Patient ID: Emonee Winkowski, MRN: [id number], DOB/AGE: 11/26/1960 54 y.o. Admit date: (Not on file) Date of Consult: 08/28/2014  Primary Physician: Thomos Lemons, DO Primary Cardiologist: SK  Chief Complaint: SVT   HPI Janeth Terry is a 54 y.o. female  Seen again after a hiatus of 3-1/2 years for SVT. She was last seen 11/12 which had also been after hiatus of 3 years. 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 She has a history of hypertension and had been treated with calcium blocker therapy; she is currently not taking   Emergency room records from 11/15 were reviewed; laboratories were unrevealing  Blood pressures have normalized.  Frequency of episodes occurs about every 3 years.  She also complains of intermittent tingling in her left hand      Past Medical History  Diagnosis Date  . Hypertension   . SVT (supraventricular tachycardia)     recurrent      Surgical History:  Past Surgical History  Procedure Laterality Date  . Knee surgery       Home Meds: Prior to Admission medications   Not on File      Allergies: No Known Allergies  History   Social History  . Marital Status: Single    Spouse Name: N/A  . Number of Children: 1  . Years of Education: N/A   Occupational History  .  At [employer]   Social History Main Topics  . Smoking status: Never Smoker   . Smokeless tobacco: Never Used  . Alcohol Use: No  . Drug Use: No  . Sexual Activity: Not on file   Other Topics Concern  . Not on file   Social History Narrative     Family History  Problem Relation Age of Onset  . Diabetes Neg Hx      ROS:  Please see the history of present illness.     All other systems reviewed and negative.    Physical Exam: Blood pressure 128/88, pulse 80, height  (1.676 m), weight 318 lb (144.244 kg). General: Well developed, Morbidly obese   female in no acute distress. Head: Normocephalic,  atraumatic, sclera non-icteric, no xanthomas, nares are without discharge. EENT: normal Lymph Nodes:  none Back: without scoliosis/kyphosis, no CVA tendersness Neck: Negative for carotid bruits. JVD not elevated. Lungs: Clear bilaterally to auscultation without wheezes, rales, or rhonchi. Breathing is unlabored. Heart: RRR with S1 S2. No  murmur , rubs, or gallops appreciated. Abdomen: Soft, non-tender, non-distended with normoactive bowel sounds. No hepatomegaly. No rebound/guarding. No obvious abdominal masses. Msk:  Strength and tone appear normal for age. Extremities: No clubbing or cyanosis. No edema.  Distal pedal pulses are 2+ and equal bilaterally. Skin: Warm and Dry Neuro: Alert and oriented X 3. CN III-XII intact  she has weakness with opposition of her thumb and thumb-second finger ring as well as thenar wasting  Psych:  Responds to questions appropriately with a normal affect.      Labs: Cardiac Enzymes No results for input(s): CKTOTAL, CKMB, TROPONINI in the last 72 hours. CBC Lab Results  Component Value Date   WBC 12.7* 04/07/2014   HGB 13.1 04/07/2014   HCT 40.0 04/07/2014   MCV 81.8 04/07/2014   PLT 421* 04/07/2014   PROTIME: No results for input(s): LABPROT, INR in the last 72 hours. Chemistry No results for input(s): NA, K, CL, CO2, BUN, CREATININE, CALCIUM, PROT, BILITOT, ALKPHOS, ALT, AST, GLUCOSE in  the last 168 hours.  Invalid input(s): LABALBU Lipids No results found for: CHOL, HDL, LDLCALC, TRIG BNP No results found for: PROBNP Thyroid Function Tests: No results for input(s): TSH, T4TOTAL, T3FREE, THYROIDAB in the last 72 hours.  Invalid input(s): FREET3    Miscellaneous No results found for: DDIMER  Radiology/Studies:  No results found.  EKG: ECG demonstrates sinus rhythm at 80 intervals 18/11/42 Inverted P waves in the inferior leads PVC-occasional with an inferior axis morphology   Assessment and Plan:  SVT  PVCs  Left hand  weakness with tingling \ Her SVT has been documented before electrocardiographically and is consistent with AV nodal reentry. Given the infrequency of events, she is not interested in pursuing catheter ablation at this time. We have given her prescription for Inderal to take as needed to see if that does not augment her ability to terminated with vagal maneuvers which we reviewed today.  In the event that the frequency increases significantly, we will regroup prior to her scheduled revisit in 2 years  I suggested that she try carpal tunnel splint, however, if the symptoms do not abate she should follow-up with her PCP for further neurological evaluation.  PVC is identified; they are infrequent as determined by her tracing. They were not noted on her prior tracing at the hospital   Supportive of the observation of the are infrequent    Sherryl MangesSteven Klein

## 2014-10-07 ENCOUNTER — Ambulatory Visit: Payer: PRIVATE HEALTH INSURANCE | Admitting: Internal Medicine

## 2015-11-11 ENCOUNTER — Other Ambulatory Visit: Payer: Self-pay | Admitting: Family Medicine

## 2015-11-11 DIAGNOSIS — Z1231 Encounter for screening mammogram for malignant neoplasm of breast: Secondary | ICD-10-CM

## 2015-11-26 ENCOUNTER — Ambulatory Visit: Payer: PRIVATE HEALTH INSURANCE

## 2016-01-08 ENCOUNTER — Encounter (HOSPITAL_COMMUNITY): Payer: Self-pay | Admitting: *Deleted

## 2016-01-14 ENCOUNTER — Other Ambulatory Visit: Payer: Self-pay | Admitting: Gastroenterology

## 2016-01-15 ENCOUNTER — Ambulatory Visit (HOSPITAL_COMMUNITY)
Admission: RE | Admit: 2016-01-15 | Discharge: 2016-01-15 | Disposition: A | Discharge status: Home / Self Care | Payer: PRIVATE HEALTH INSURANCE | Source: Ambulatory Visit | Attending: Gastroenterology | Admitting: Gastroenterology

## 2016-01-15 ENCOUNTER — Ambulatory Visit (HOSPITAL_COMMUNITY): Payer: PRIVATE HEALTH INSURANCE | Admitting: Certified Registered"

## 2016-01-15 ENCOUNTER — Encounter (HOSPITAL_COMMUNITY)
Admission: RE | Disposition: A | Discharge status: Home / Self Care | Payer: Self-pay | Source: Ambulatory Visit | Attending: Gastroenterology

## 2016-01-15 ENCOUNTER — Encounter (HOSPITAL_COMMUNITY): Payer: Self-pay

## 2016-01-15 DIAGNOSIS — M1990 Unspecified osteoarthritis, unspecified site: Secondary | ICD-10-CM | POA: Diagnosis not present

## 2016-01-15 DIAGNOSIS — K5730 Diverticulosis of large intestine without perforation or abscess without bleeding: Secondary | ICD-10-CM | POA: Diagnosis not present

## 2016-01-15 DIAGNOSIS — K648 Other hemorrhoids: Secondary | ICD-10-CM | POA: Insufficient documentation

## 2016-01-15 DIAGNOSIS — I10 Essential (primary) hypertension: Secondary | ICD-10-CM | POA: Insufficient documentation

## 2016-01-15 DIAGNOSIS — Z1211 Encounter for screening for malignant neoplasm of colon: Secondary | ICD-10-CM | POA: Diagnosis not present

## 2016-01-15 HISTORY — PX: COLONOSCOPY: SHX5424

## 2016-01-15 HISTORY — DX: Unspecified osteoarthritis, unspecified site: M19.90

## 2016-01-15 SURGERY — COLONOSCOPY
Anesthesia: Monitor Anesthesia Care

## 2016-01-15 MED ORDER — PROPOFOL 10 MG/ML IV BOLUS
INTRAVENOUS | Status: AC
  Filled 2016-01-15: qty 20

## 2016-01-15 MED ORDER — HYDROMORPHONE HCL 1 MG/ML IJ SOLN: 0.2500 mg | INTRAMUSCULAR | Status: DC | PRN

## 2016-01-15 MED ORDER — LIDOCAINE HCL (PF) 2 % IJ SOLN
INTRAMUSCULAR | Status: DC | PRN
  Administered 2016-01-15: 20 mg via INTRADERMAL

## 2016-01-15 MED ORDER — LACTATED RINGERS IV SOLN
INTRAVENOUS | Status: DC
  Administered 2016-01-15: 09:00:00 via INTRAVENOUS

## 2016-01-15 MED ORDER — ONDANSETRON HCL 4 MG/2ML IJ SOLN: 4.0000 mg | Freq: Once | INTRAMUSCULAR | Status: DC | PRN

## 2016-01-15 MED ORDER — PROPOFOL 10 MG/ML IV BOLUS
INTRAVENOUS | Status: DC | PRN
  Administered 2016-01-15: 100 mg via INTRAVENOUS
  Administered 2016-01-15: 50 mg via INTRAVENOUS
  Administered 2016-01-15: 100 mg via INTRAVENOUS

## 2016-01-15 MED ORDER — LABETALOL HCL 5 MG/ML IV SOLN
10.0000 mg | Freq: Once | INTRAVENOUS | Status: AC
  Administered 2016-01-15: 10 mg via INTRAVENOUS
  Filled 2016-01-15: qty 4

## 2016-01-15 MED ORDER — MEPERIDINE HCL 100 MG/ML IJ SOLN: 6.2500 mg | INTRAMUSCULAR | Status: DC | PRN

## 2016-01-15 NOTE — Discharge Instructions (Signed)

## 2016-01-15 NOTE — Anesthesia Postprocedure Evaluation (Signed)
Anesthesia Post Note  Patient: Nicole Frazier  Procedure(s) Performed: Procedure(s) (LRB): COLONOSCOPY (N/A)  Patient location during evaluation: PACU Anesthesia Type: MAC Level of consciousness: awake and alert Pain management: pain level controlled Vital Signs Assessment: post-procedure vital signs reviewed and stable Respiratory status: spontaneous breathing, nonlabored ventilation, respiratory function stable and patient connected to nasal cannula oxygen Cardiovascular status: stable and blood pressure returned to baseline Anesthetic complications: no    Last Vitals:  Vitals:   01/15/16 1020 01/15/16 1028  BP: (!) 150/107 (!) 155/91  Pulse: 71 70  Resp: 15 17  Temp:      Last Pain:  Vitals:   01/15/16 0807  TempSrc: Oral                 [name] DAVID

## 2016-01-15 NOTE — Transfer of Care (Signed)
Immediate Anesthesia Transfer of Care Note  Patient: Nicole Frazier  Procedure(s) Performed: Procedure(s): COLONOSCOPY (N/A)  Patient Location: PACU  Anesthesia Type:MAC  Level of Consciousness:  sedated, patient cooperative and responds to stimulation  Airway & Oxygen Therapy:Patient Spontanous Breathing and Patient   Post-op Assessment:  Report given to PACU RN and Post -op Vital signs reviewed and stable  Post vital signs:  Reviewed and stable  Last Vitals:  Vitals:   01/15/16 0832 01/15/16 0900  BP: (!) 180/141 (!) 194/106  Pulse:    Resp:    Temp:      Complications: No apparent anesthesia complicationsImmediate Anesthesia Transfer of Care Note  Patient: Nicole Frazier  Procedure(s) Performed: Procedure(s): COLONOSCOPY (N/A)  Patient Location: PACU  Anesthesia Type:MAC  Level of Consciousness: awake, alert , oriented and patient cooperative  Airway & Oxygen Therapy: Patient Spontanous Breathing  Post-op Assessment: Report given to RN and Post -op Vital signs reviewed and stable  Post vital signs: Reviewed and stable  Last Vitals:  Vitals:   01/15/16 0832 01/15/16 0900  BP: (!) 180/141 (!) 194/106  Pulse:    Resp:    Temp:      Last Pain:  Vitals:   01/15/16 0807  TempSrc: Oral         Complications: No apparent anesthesia complications

## 2016-01-15 NOTE — Anesthesia Preprocedure Evaluation (Signed)
Anesthesia Evaluation  Patient identified by MRN, date of birth, ID band Patient awake    Reviewed: Allergy & Precautions, NPO status , Patient's Chart, lab work & pertinent test results  Airway Mallampati: I  TM Distance: >3 FB Neck ROM: Full    Dental   Pulmonary    Pulmonary exam normal        Cardiovascular hypertension, Pt. on medications Normal cardiovascular exam     Neuro/Psych    GI/Hepatic   Endo/Other    Renal/GU      Musculoskeletal   Abdominal   Peds  Hematology   Anesthesia Other Findings   Reproductive/Obstetrics                             Anesthesia Physical Anesthesia Plan  ASA: II  Anesthesia Plan: MAC   Post-op Pain Management:    Induction: Intravenous  Airway Management Planned: Mask  Additional Equipment:   Intra-op Plan:   Post-operative Plan:   Informed Consent: I have reviewed the patients History and Physical, chart, labs and discussed the procedure including the risks, benefits and alternatives for the proposed anesthesia with the patient or authorized representative who has indicated his/her understanding and acceptance.     Plan Discussed with: CRNA and Surgeon  Anesthesia Plan Comments:         Anesthesia Quick Evaluation

## 2016-01-15 NOTE — H&P (Signed)
  The patient is a 55 year old female who is here today for screening colonoscopy.  Physical  No distress  Heart regular rhythm no murmurs  Lungs clear  Abdomen soft and nontender  Impression: Screening colonoscopy  Plan colonoscopy

## 2016-01-15 NOTE — Op Note (Signed)
[facility] Patient Name: Nicole Frazier Procedure Date: 01/15/2016 MRN: [id number] Attending MD: Graylin Shiver , MD Date of Birth: [birth year] CSN: [id number] Age: 55 Admit Type: Inpatient Procedure:                Colonoscopy Indications:              Screening for colorectal malignant neoplasm, This                            is the patient's first colonoscopy Providers:                Graylin Shiver, MD, Priscella Mann, RN, Beryle Beams, Technician, Early Osmond, CRNA Referring MD:              Medicines:                Propofol per Anesthesia Complications:            No immediate complications. Estimated Blood Loss:     Estimated blood loss: none. Procedure:                Pre-Anesthesia Assessment:                           - Prior to the procedure, a History and Physical                            was performed, and patient medications and                            allergies were reviewed. The patient's tolerance of                            previous anesthesia was also reviewed. The risks                            and benefits of the procedure and the sedation                            options and risks were discussed with the patient.                            All questions were answered, and informed consent                            was obtained. Prior Anticoagulants: The patient has                            taken no previous anticoagulant or antiplatelet                            agents. ASA Grade Assessment: III - A patient with  severe systemic disease. After reviewing the risks                            and benefits, the patient was deemed in                            satisfactory condition to undergo the procedure.                           After obtaining informed consent, the colonoscope                            was passed under direct vision. Throughout the   procedure, the patient's blood pressure, pulse, and                            oxygen saturations were monitored continuously. The                            EC-[id number] ([id number] scope was introduced through                            the anus and advanced to the the cecum, identified                            by appendiceal orifice and ileocecal valve. The                            colonoscopy was performed without difficulty. The                            patient tolerated the procedure well. The quality                            of the bowel preparation was good. The ileocecal                            valve, appendiceal orifice, and rectum were                            photographed. Scope In: 9:57:07 AM Scope Out: 10:10:21 AM Scope Withdrawal Time: 0 hours 8 minutes 0 seconds  Total Procedure Duration: 0 hours 13 minutes 14 seconds  Findings:      The perianal and digital rectal examinations were normal.      A few small-mouthed diverticula were found in the sigmoid colon.      Internal hemorrhoids were found during retroflexion. The hemorrhoids       were small.      The exam was otherwise without abnormality. Impression:               - Diverticulosis in the sigmoid colon.                           - Internal hemorrhoids.                           -  The examination was otherwise normal.                           - No specimens collected. Moderate Sedation:      . Recommendation:           - Resume regular diet.                           - Continue present medications.                           - Repeat colonoscopy in 10 years for screening                            purposes. Procedure Code(s):        --- Professional ---                           [phone], Colonoscopy, flexible; diagnostic, including                            collection of specimen(s) by brushing or washing,                            when performed (separate procedure) Diagnosis Code(s):        ---  Professional ---                           Z12.11, Encounter for screening for malignant                            neoplasm of colon                           K64.8, Other hemorrhoids                           K57.30, Diverticulosis of large intestine without                            perforation or abscess without bleeding CPT copyright 2016 American Medical Association. All rights reserved. The codes documented in this report are preliminary and upon coder review may  be revised to meet current compliance requirements. Graylin Shiver, MD 01/15/2016 10:17:41 AM This report has been signed electronically. Number of Addenda: 0

## 2016-01-16 ENCOUNTER — Encounter (HOSPITAL_COMMUNITY): Payer: Self-pay | Admitting: Gastroenterology

## 2016-02-06 ENCOUNTER — Ambulatory Visit
Admission: RE | Admit: 2016-02-06 | Discharge: 2016-02-06 | Disposition: A | Discharge status: Home / Self Care | Payer: PRIVATE HEALTH INSURANCE | Source: Ambulatory Visit | Attending: Family Medicine | Admitting: Family Medicine

## 2016-02-06 DIAGNOSIS — Z1231 Encounter for screening mammogram for malignant neoplasm of breast: Secondary | ICD-10-CM

## 2016-02-10 ENCOUNTER — Other Ambulatory Visit: Payer: Self-pay | Admitting: Family Medicine

## 2016-02-10 DIAGNOSIS — R928 Other abnormal and inconclusive findings on diagnostic imaging of breast: Secondary | ICD-10-CM

## 2016-02-20 ENCOUNTER — Ambulatory Visit
Admission: RE | Admit: 2016-02-20 | Discharge: 2016-02-20 | Disposition: A | Discharge status: Home / Self Care | Payer: PRIVATE HEALTH INSURANCE | Source: Ambulatory Visit | Attending: Family Medicine | Admitting: Family Medicine

## 2016-02-20 DIAGNOSIS — R928 Other abnormal and inconclusive findings on diagnostic imaging of breast: Secondary | ICD-10-CM

## 2016-09-01 ENCOUNTER — Encounter: Payer: Self-pay | Admitting: Internal Medicine

## 2016-09-13 ENCOUNTER — Encounter (INDEPENDENT_AMBULATORY_CARE_PROVIDER_SITE_OTHER): Payer: Self-pay

## 2016-09-13 ENCOUNTER — Ambulatory Visit (INDEPENDENT_AMBULATORY_CARE_PROVIDER_SITE_OTHER): Payer: PRIVATE HEALTH INSURANCE | Admitting: Internal Medicine

## 2016-09-13 VITALS — BP 162/110 | HR 84 | Ht 67.0 in | Wt 327.0 lb

## 2016-09-13 DIAGNOSIS — I471 Supraventricular tachycardia: Secondary | ICD-10-CM | POA: Diagnosis not present

## 2016-09-13 DIAGNOSIS — I493 Ventricular premature depolarization: Secondary | ICD-10-CM

## 2016-09-13 MED ORDER — HYDROCHLOROTHIAZIDE 25 MG PO TABS: ORAL_TABLET | ORAL | 2 refills | Status: DC

## 2016-09-13 MED ORDER — PROPRANOLOL HCL 10 MG PO TABS: ORAL_TABLET | ORAL | 2 refills | Status: DC

## 2016-09-13 MED ORDER — DILTIAZEM HCL ER COATED BEADS 180 MG PO CP24: 180.0000 mg | ORAL_CAPSULE | Freq: Every day | ORAL | 3 refills | Status: DC

## 2016-09-13 NOTE — Progress Notes (Signed)
      Patient Care Team: Doe-Hyun Sherran Needs, DO as PCP - General   HPI  Nicole Frazier is a 56 y.o. female seen in follow-up for supraventricular tachycardia. 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 She was last seen 3/16 with infrequent symptoms and no definitive therapy was pursued  She has had a couple of episodes associated with missing/running out of her metoprolol which she takes also for her hypertension. She took a dose of propranolol this helped terminated her tachycardia. The continue to occur at night.  She denies sleep disorder breathing or daytime somnolence   Records and Results Reviewed   Past Medical History:  Diagnosis Date  . Arthritis    knees  . Hypertension   . SVT (supraventricular tachycardia) (HCC)    recurrent    Past Surgical History:  Procedure Laterality Date  . COLONOSCOPY N/A 01/15/2016   Procedure: COLONOSCOPY;  Surgeon: Graylin Shiver, MD;  Location: [facility];  Service: Endoscopy;  Laterality: N/A;  . KNEE SURGERY Bilateral yrs ago   arthroscopic    Current Outpatient Prescriptions  Medication Sig Dispense Refill  . propranolol (INDERAL) 10 MG tablet Take one tablet (10 mg total) by mouth as needed for SVT. Take as directed by Dr. Graciela Husbands. 10 tablet 2  . diltiazem (CARDIZEM CD) 180 MG 24 hr capsule Take 1 capsule (180 mg total) by mouth daily. 90 capsule 3  . hydrochlorothiazide (HYDRODIURIL) 25 MG tablet Take one tablet (25 mg) by mouth once daily as needed for swelling 20 tablet 2   No current facility-administered medications for this visit.     No Known Allergies    Review of Systems negative except from HPI and PMH  Physical Exam BP (!) 162/110   Pulse 84   Ht  (1.702 m)   Wt (!) 327 lb (148.3 kg)   BMI 51.22 kg/m  Well developed and Morbidly obese  in no acute distress HENT normal E scleral and icterus clear Neck Supple JVP flat; carotids brisk and full Clear to ausculation  Regular rate and rhythm, no  murmurs gallops or rub Soft with active bowel sounds No clubbing cyanosis  Edema Alert and oriented, grossly normal motor and sensory function Skin Warm and Dry  ECG personally reviewed sinus rhythm 84 15/11/41  LVH Poor R-wave progression   Assessment and  Plan 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 We have reviewed side effects including the potential for edema which is intermittently a problem.  She will continue to use propranolol on an as-needed basis.  We will give her hydrochlorothiazide to use on an as-needed basis for edema.  I've encouraged her in weight loss. She is doing something to her insurance company we have discussed the [employer] and the importance of concomitant exercise.  We spent more than 50% of our >25 min visit in face to face counseling regarding the above     Current medicines are reviewed at length with the patient today .  The patient does nhave concerns regarding medicines.

## 2016-09-13 NOTE — Patient Instructions (Signed)
Medication Instructions: - Your physician has recommended you make the following change in your medication:  1) Stop metoprolol succinate 2) Start diltiazem cd 180 mg- take one tablet by mouth once daily 3) Start hydrochlorothiazide (hctz) 25 mg- take one tablet by mouth once daily as needed for swelling  Labwork: - none ordered  Procedures/Testing: - none ordered  Follow-Up: - Your physician wants you to follow-up in: 1 year with Dr. Graciela Husbands. You will receive a reminder letter in the mail two months in advance. If you don't receive a letter, please call our office to schedule the follow-up appointment.   Any Additional Special Instructions Will Be Listed Below (If Applicable).     If you need a refill on your cardiac medications before your next appointment, please call your pharmacy.

## 2017-01-04 ENCOUNTER — Other Ambulatory Visit: Payer: Self-pay | Admitting: Family Medicine

## 2017-01-04 DIAGNOSIS — R921 Mammographic calcification found on diagnostic imaging of breast: Secondary | ICD-10-CM

## 2017-01-06 ENCOUNTER — Other Ambulatory Visit: Payer: Self-pay | Admitting: Internal Medicine

## 2017-07-19 ENCOUNTER — Ambulatory Visit: Payer: PRIVATE HEALTH INSURANCE | Admitting: Physician Assistant

## 2017-07-26 ENCOUNTER — Ambulatory Visit: Payer: PRIVATE HEALTH INSURANCE | Admitting: Internal Medicine

## 2017-07-26 ENCOUNTER — Encounter: Payer: Self-pay | Admitting: Internal Medicine

## 2017-07-26 VITALS — BP 172/102 | HR 87 | Ht 66.0 in | Wt 334.2 lb

## 2017-07-26 DIAGNOSIS — M7989 Other specified soft tissue disorders: Secondary | ICD-10-CM

## 2017-07-26 DIAGNOSIS — I471 Supraventricular tachycardia: Secondary | ICD-10-CM

## 2017-07-26 DIAGNOSIS — I493 Ventricular premature depolarization: Secondary | ICD-10-CM

## 2017-07-26 NOTE — Progress Notes (Signed)
Patient Care Team: Reather ConverseMassenburg, O'Laf, PA-C as PCP - General (Physician Assistant)   HPI  Nicole Frazier is a 57 y.o. female seen in follow-up for supraventricular tachycardia. 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 She was last seen 3/16 with infrequent symptoms and no definitive therapy was pursued  Is no interval tachypalpitations  She developed a acute problem with pain in inability to ambulate on her right ankle a month or 2 ago.  She was seen in urgent care and a diagnosis of gout was made.  She describes the entire foot has been swollen as opposed to a joint.  Her hydrochlorothiazide was discontinued.  Her diltiazem was increased.  She has had recurrent episodes of joint swelling and tenderness but not to the same degree.  She denies sleep disorder breathing or daytime somnolence  Unfortunately her weight has continued to be an issue.  She has gained about 10 pounds in the last year.  Blood pressure is poorly controlled.  She has been on a large number of antihypertensives under the care of Dr. Levora DredgeEhringer  in the past.  We will work on getting those medications from her pharmacy.  Records and Results Reviewed   Past Medical History:  Diagnosis Date  . Arthritis    knees  . Hypertension   . SVT (supraventricular tachycardia) (HCC)    recurrent    Past Surgical History:  Procedure Laterality Date  . COLONOSCOPY N/A 01/15/2016   Procedure: COLONOSCOPY;  Surgeon: Graylin ShiverSalem F Ganem, MD;  Location: [facility];  Service: Endoscopy;  Laterality: N/A;  . KNEE SURGERY Bilateral yrs ago   arthroscopic    Current Outpatient Medications  Medication Sig Dispense Refill  . propranolol (INDERAL) 10 MG tablet Take one tablet (10 mg total) by mouth as needed for SVT. Take as directed by Dr. Graciela HusbandsKlein. 10 tablet 2  . diltiazem (CARDIZEM CD) 180 MG 24 hr capsule Take 1 capsule (180 mg total) by mouth daily. 90 capsule 3  . hydrochlorothiazide (HYDRODIURIL) 25 MG tablet TAKE 1  TABLET(25 MG) BY MOUTH EVERY DAY AS NEEDED FOR SWELLING (Patient not taking: Reported on 07/26/2017) 90 tablet 2   No current facility-administered medications for this visit.     No Known Allergies    Review of Systems negative except from HPI and PMH  Physical Exam BP (!) 172/102   Pulse 87   Ht 5\' 6"  (1.676 m)   Wt (!) 334 lb 3.2 oz (151.6 kg)   SpO2 95%   BMI 53.94 kg/m  Well developed Morbidly obese  in no acute distress HENT normal Neck supple with JVP-flat Clear Regular rate and rhythm, no murmurs or gallops Abd-soft with active BS No Clubbing cyanosis mild swelling of her right leg but considerable swelling of her right ankle edema Skin-warm and dry A & Oriented  Grossly normal sensory and motor function   ECG personally reviewed   Atrial rhythm at 87 P waves are inverted diffusely suggesting a left lower atrial focus Intervals 15/11/40  Assessment and  Plan 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  Because of her prior history of multiple drug effectiveness and intolerances, we will work on getting a list of her prior medications prior to making a recommendation to her primary care physician.  With her right lower extremity swelling, quality is been  intermittent I am concerned given her size about deep vein thrombosis.  We will obtain venous Dopplers.  Her ectopic atrial focus is been stable over the years.  We discussed again the importance of exercise for weight loss.  Suggested she try water aerobics.  More than 50% of 40 min was spent in counseling related to the above     Current medicines are reviewed at length with the patient today .  The patient does nhave concerns regarding medicines.

## 2017-07-26 NOTE — Patient Instructions (Addendum)
Medication Instructions:  Your physician recommends that you continue on your current medications as directed. Please refer to the Current Medication list given to you today.  Labwork: None ordered.  Testing/Procedures: Your physician has requested that you have a lower extremity venous duplex. This test is an ultrasound of the veins in the legs or arms. It looks at venous blood flow that carries blood from the heart to the legs or arms. Allow one hour for a Lower Venous exam. Allow thirty minutes for an Upper Venous exam. There are no restrictions or special instructions.   Follow-Up: Your physician recommends that you schedule a follow-up appointment in: One Year with Dr Graciela HusbandsKlein  We will contact you to schedule your doppler study.  Any Other Special Instructions Will Be Listed Below (If Applicable).     If you need a refill on your cardiac medications before your next appointment, please call your pharmacy.

## 2017-07-28 ENCOUNTER — Ambulatory Visit (HOSPITAL_COMMUNITY)
Admission: RE | Admit: 2017-07-28 | Discharge: 2017-07-28 | Disposition: A | Discharge status: Home / Self Care | Payer: PRIVATE HEALTH INSURANCE | Source: Ambulatory Visit | Attending: Cardiology | Admitting: Cardiology

## 2017-07-28 DIAGNOSIS — M7989 Other specified soft tissue disorders: Secondary | ICD-10-CM | POA: Diagnosis not present

## 2017-08-01 ENCOUNTER — Telehealth: Payer: Self-pay | Admitting: Internal Medicine

## 2017-08-01 NOTE — Telephone Encounter (Signed)
L  I think we were going to call her PCP or her pharmacy to get a list of BP meds tried If we cant get it,we willneed to have her call her PCP  Can you also find out who it is  tnak s

## 2017-08-01 NOTE — Telephone Encounter (Signed)
Patient has concerns about medication adjustments made at last Tues OV...  She said that a medication adjustment was made, but not called in and she took last dose on Sunday..  Please address, thank you

## 2017-08-01 NOTE — Telephone Encounter (Signed)
Lpmtcb 2/25 md

## 2017-08-01 NOTE — Telephone Encounter (Signed)
New message     Prescription was not called in last Tuesday when she was in the office , she is completely out of medication and needs refill called into  - [facility] on [address]  - 90 day supply    Patient did not know what medication he was changing her too.

## 2017-08-02 NOTE — Telephone Encounter (Signed)
Called pt's pharmacy and verified she is has only taken Cardia 280mg  (currently), HCTZ (held for gout), and Toprol 125 ER (dc'd 2017). Pt stated she has had many prescriptions for BP but has never filled or tried them bc of potential side effects. I stated I would forward this info to Dr Graciela HusbandsKlein for him to review.

## 2017-08-03 MED ORDER — LABETALOL HCL 200 MG PO TABS: 200.0000 mg | ORAL_TABLET | Freq: Two times a day (BID) | ORAL | 3 refills | Status: DC

## 2017-08-03 NOTE — Telephone Encounter (Signed)
Left message on patients identified voicemail with Dr Odessa FlemingKlein's new order for her to start Labetalol 200mg  tablet twice daily for hypertension. Medication order sent to pt pharmacy.

## 2018-07-14 ENCOUNTER — Encounter: Payer: Self-pay | Admitting: Internal Medicine

## 2018-08-02 ENCOUNTER — Ambulatory Visit: Payer: PRIVATE HEALTH INSURANCE | Admitting: Internal Medicine

## 2018-08-02 ENCOUNTER — Encounter: Payer: Self-pay | Admitting: Internal Medicine

## 2018-08-02 VITALS — BP 158/115 | HR 156 | Ht 66.0 in | Wt 335.4 lb

## 2018-08-02 DIAGNOSIS — M7989 Other specified soft tissue disorders: Secondary | ICD-10-CM

## 2018-08-02 DIAGNOSIS — I493 Ventricular premature depolarization: Secondary | ICD-10-CM | POA: Diagnosis not present

## 2018-08-02 DIAGNOSIS — I10 Essential (primary) hypertension: Secondary | ICD-10-CM | POA: Diagnosis not present

## 2018-08-02 DIAGNOSIS — I471 Supraventricular tachycardia: Secondary | ICD-10-CM | POA: Diagnosis not present

## 2018-08-02 MED ORDER — ADENOSINE 6 MG/2ML IV SOLN
12.0000 mg | Freq: Once | INTRAVENOUS | Status: AC
  Administered 2018-08-02: 12 mg via INTRAVENOUS

## 2018-08-02 MED ORDER — LOSARTAN POTASSIUM-HCTZ 50-12.5 MG PO TABS: 1.0000 | ORAL_TABLET | Freq: Every day | ORAL | 3 refills | Status: DC

## 2018-08-02 NOTE — Patient Instructions (Signed)
Medication Instructions:  Your physician has recommended you make the following change in your medication:   1. Begin Hyzaar (losartan/hctz) 50/12.5mg  tablet, once daily  Labwork: Your physician recommends that you return for lab work in:   2 weeks for a BMP to check potassium and kidney function.  Testing/Procedures: Your physician has recommended that you have an ablation. Catheter ablation is a medical procedure used to treat some cardiac arrhythmias (irregular heartbeats). During catheter ablation, a long, thin, flexible tube is put into a blood vessel in your groin (upper thigh), or neck. This tube is called an ablation catheter. It is then guided to your heart through the blood vessel. Radio frequency waves destroy small areas of heart tissue where abnormal heartbeats may cause an arrhythmia to start.    Follow-Up: Your physician recommends that you schedule a follow-up appointment in:   One Year with Dr Graciela Husbands  Any Other Special Instructions Will Be Listed Below (If Applicable).  Please call the office if you would like a referral for an SVT ablation.   If you need a refill on your cardiac medications before your next appointment, please call your pharmacy.

## 2018-08-02 NOTE — Progress Notes (Signed)
      Patient Care Team: Reather Converse, PA-C as PCP - General (Physician Assistant)   HPI  Nicole Frazier is a 58 y.o. female seen in follow-up for supraventricular tachycardia. 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 She was last seen 3/16 with infrequent symptoms and no definitive therapy was pursued  She comes in today with tachycardia.  No recent episodes until now.  Shortness of breath and chest discomfort.  Otherwise functional status is been quite good.  No edema or worsening shortness of breath.  Modestly limited by chronic dyspnea and obesity.   Records and Results Reviewed   Past Medical History:  Diagnosis Date  . Arthritis    knees  . Hypertension   . SVT (supraventricular tachycardia) (HCC)    recurrent    Past Surgical History:  Procedure Laterality Date  . COLONOSCOPY N/A 01/15/2016   Procedure: COLONOSCOPY;  Surgeon: Graylin Shiver, MD;  Location: [facility];  Service: Endoscopy;  Laterality: N/A;  . KNEE SURGERY Bilateral yrs ago   arthroscopic    Current Outpatient Medications  Medication Sig Dispense Refill  . labetalol (NORMODYNE) 200 MG tablet Take 1 tablet (200 mg total) by mouth 2 (two) times daily. 180 tablet 3  . propranolol (INDERAL) 10 MG tablet Take one tablet (10 mg total) by mouth as needed for SVT. Take as directed by Dr. Graciela Husbands. 10 tablet 2   No current facility-administered medications for this visit.     No Known Allergies    Review of Systems negative except from HPI and PMH  Physical Exam Pulse (!) 156   Ht 5\' 6"  (1.676 m)   Wt (!) 335 lb 6.4 oz (152.1 kg)   SpO2 96%   BMI 54.13 kg/m  Well developed and Morbidly obese in no acute distress HENT normal Neck supple with JVP-flat Clear Very rapid regular rate and rhythm, no gallop  2/6 murmur Abd-soft with active BS No Clubbing cyanosis   edema Skin-warm and dry A & Oriented  Grossly normal sensory and motor function  ECG SVT with a pseudo-R prime in lead V1  cycle length 400 ms  Adenosine 12 mg given with termination of tachycardia  Post adenosine ECG demonstrates sinus rhythm at 107 intervals 15/10/38 Axis left -26  Assessment and  Plan 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  Received adenosine with conversion.  Failed carotid sinus massage x4.  Had discussion again regarding treatment options.  She would like to consider catheter ablation but is not quite ready yet.  For now we will continue her on her current medical regime of labetalol.  Blood pressure remains elevated.  We will begin losartan/HCT.  Have reviewed side effects.  These drugs also have a likely impact of improving blood pressure related untowards  We spent more than 50% of our >25 min visit in face to face counseling regarding the above       Current medicines are reviewed at length with the patient today .  The patient does nhave concerns regarding medicines.

## 2018-08-17 ENCOUNTER — Other Ambulatory Visit: Payer: PRIVATE HEALTH INSURANCE

## 2018-08-17 ENCOUNTER — Other Ambulatory Visit: Payer: Self-pay

## 2018-08-17 DIAGNOSIS — I471 Supraventricular tachycardia: Secondary | ICD-10-CM

## 2018-08-17 DIAGNOSIS — M7989 Other specified soft tissue disorders: Secondary | ICD-10-CM

## 2018-08-17 DIAGNOSIS — I493 Ventricular premature depolarization: Secondary | ICD-10-CM

## 2018-08-17 DIAGNOSIS — I10 Essential (primary) hypertension: Secondary | ICD-10-CM

## 2018-08-17 LAB — BASIC METABOLIC PANEL
BUN/Creatinine Ratio: 16 (ref 9–23)
BUN: 14 mg/dL (ref 6–24)
CO2: 23 mmol/L (ref 20–29)
CREATININE: 0.87 mg/dL (ref 0.57–1.00)
Calcium: 9.2 mg/dL (ref 8.7–10.2)
Chloride: 99 mmol/L (ref 96–106)
GFR calc Af Amer: 85 mL/min/{1.73_m2} (ref >59)
GFR calc non Af Amer: 74 mL/min/{1.73_m2} (ref >59)
GLUCOSE: 75 mg/dL (ref 65–99)
POTASSIUM: 3.7 mmol/L (ref 3.5–5.2)
SODIUM: 139 mmol/L (ref 134–144)

## 2018-08-21 ENCOUNTER — Other Ambulatory Visit: Payer: Self-pay

## 2018-08-21 MED ORDER — PROPRANOLOL HCL 10 MG PO TABS: ORAL_TABLET | ORAL | 2 refills | Status: AC

## 2018-08-25 ENCOUNTER — Other Ambulatory Visit: Payer: Self-pay | Admitting: Internal Medicine

## 2019-08-02 ENCOUNTER — Ambulatory Visit: Payer: PRIVATE HEALTH INSURANCE | Admitting: Physician Assistant

## 2019-08-09 ENCOUNTER — Ambulatory Visit: Payer: PRIVATE HEALTH INSURANCE | Attending: Internal Medicine

## 2019-08-09 DIAGNOSIS — Z23 Encounter for immunization: Secondary | ICD-10-CM

## 2019-08-09 NOTE — Progress Notes (Signed)
   Covid-19 Vaccination Clinic  Name:  Nicole Frazier    MRN: [id number] DOB: 1960/06/27  08/09/2019  [name] was observed post Covid-19 immunization for 15 minutes without incident. She was provided with Vaccine Information Sheet and instruction to access the V-Safe system.   [name] was instructed to call 911 with any severe reactions post vaccine: Marland Kitchen Difficulty breathing  . Swelling of face and throat  . A fast heartbeat  . A bad rash all over body  . Dizziness and weakness   Immunizations Administered    Name Date Dose VIS Date Route   Pfizer COVID-19 Vaccine 08/09/2019  5:30 PM 0.3 mL 05/18/2019 Intramuscular   Manufacturer: [employer], [employer]   Lot: [id number]   NDC: [address]-5

## 2019-08-16 ENCOUNTER — Ambulatory Visit: Payer: PRIVATE HEALTH INSURANCE | Admitting: Physician Assistant

## 2019-08-16 NOTE — Progress Notes (Deleted)
Cardiology Office Note Date:  08/16/2019  Patient ID:  Nicole Frazier, DOB [birth year], MRN [id number] PCP:  Reather Converse, PA-C  Cardiologist:  Dr. Graciela Husbands  ***refresh   Chief Complaint: *** annual visit  History of Present Illness: Nicole Frazier is a 59 y.o. female with history of HTN, SVT  She comes in today to be seen for dr. Graciela Husbands, last seen by him 07/2018.  He reports her SVT initially noted 2021, and in the subsequent years with infrequent symptoms, no definitive therapy pursued.  At the time of his visit, she was having more symptoms arrived in her SVT with c/o tachycardia, SOB, CP  Carotid massage failed x4, she was given adenosine 12mg  with conversion to SR They discussed ablation, though she was not ready for that yet and continued on labetalol.  Her BP was high and losartan/HCTZ was added.  *** symptoms, palps, *** BP *** meds *** labs *** Fam hx *** R leg swelling, neg 2019 *** ablation?   Past Medical History:  Diagnosis Date  . Arthritis    knees  . Hypertension   . SVT (supraventricular tachycardia) (HCC)    recurrent    Past Surgical History:  Procedure Laterality Date  . COLONOSCOPY N/A 01/15/2016   Procedure: COLONOSCOPY;  Surgeon: 03/16/2016, MD;  Location: [facility];  Service: Endoscopy;  Laterality: N/A;  . KNEE SURGERY Bilateral yrs ago   arthroscopic    Current Outpatient Medications  Medication Sig Dispense Refill  . labetalol (NORMODYNE) 200 MG tablet TAKE 1 TABLET(200 MG) BY MOUTH TWICE DAILY 180 tablet 3  . losartan-hydrochlorothiazide (HYZAAR) 50-12.5 MG tablet Take 1 tablet by mouth daily. 90 tablet 3  . propranolol (INDERAL) 10 MG tablet Take one tablet (10 mg total) by mouth as needed for SVT. Take as directed by Dr. Graylin Shiver. 30 tablet 2   No current facility-administered medications for this visit.    Allergies:   Patient has no known allergies.   Social History:  The patient  reports that she has never smoked. She has  never used smokeless tobacco. She reports that she does not drink alcohol or use drugs.   Family History:  The patient's family history is not on file.***  ROS:  Please see the history of present illness.  All other systems are reviewed and otherwise negative.   PHYSICAL EXAM: *** VS:  There were no vitals taken for this visit. BMI: There is no height or weight on file to calculate BMI. Well nourished, well developed, in no acute distress  HEENT: normocephalic, atraumatic  Neck: no JVD, carotid bruits or masses Cardiac:  *** RRR; no significant murmurs, no rubs, or gallops Lungs:  *** CTA b/l, no wheezing, rhonchi or rales  Abd: soft, nontender MS: no deformity or *** atrophy Ext: *** no edema  Skin: warm and dry, no rash Neuro:  No gross deficits appreciated Psych: euthymic mood, full affect    EKG:  Done today and reviewed by myself shows ***  Recent Labs: 08/17/2018: BUN 14; Creatinine, Ser 0.87; Potassium 3.7; Sodium 139  No results found for requested labs within last 8760 hours.   CrCl cannot be calculated (Patient's most recent lab result is older than the maximum 21 days allowed.).   Wt Readings from Last 3 Encounters:  08/02/18 (!) 335 lb 6.4 oz (152.1 kg)  07/26/17 (!) 334 lb 3.2 oz (151.6 kg)  09/13/16 (!) 327 lb (148.3 kg)     Other studies reviewed: Additional studies/records reviewed  today include: summarized above  ASSESSMENT AND PLAN:  1. SVT     Likely AVNRT by Dr. Olin Pia note     ***  2. HTN     ***  Disposition: F/u with ***  Current medicines are reviewed at length with the patient today.  The patient did not have any concerns regarding medicines.***  Signed, Tommye Standard, PA-C 08/16/2019 5:41 AM     [facility] [address] [place] [address] [phone] (office)  [phone] (fax)

## 2019-09-03 ENCOUNTER — Other Ambulatory Visit: Payer: Self-pay | Admitting: Internal Medicine

## 2019-09-04 ENCOUNTER — Other Ambulatory Visit: Payer: Self-pay | Admitting: Internal Medicine

## 2019-09-05 ENCOUNTER — Ambulatory Visit: Payer: PRIVATE HEALTH INSURANCE

## 2019-09-10 ENCOUNTER — Ambulatory Visit: Payer: PRIVATE HEALTH INSURANCE

## 2019-09-11 ENCOUNTER — Ambulatory Visit: Payer: PRIVATE HEALTH INSURANCE | Attending: Internal Medicine

## 2019-09-11 DIAGNOSIS — Z23 Encounter for immunization: Secondary | ICD-10-CM

## 2019-09-11 NOTE — Progress Notes (Signed)
   Covid-19 Vaccination Clinic  Name:  Nicole Frazier    MRN: [id number] DOB: [birth year]  09/11/2019  [name] was observed post Covid-19 immunization for 15 minutes without incident. She was provided with Vaccine Information Sheet and instruction to access the V-Safe system.   [name] was instructed to call 911 with any severe reactions post vaccine: Marland Kitchen Difficulty breathing  . Swelling of face and throat  . A fast heartbeat  . A bad rash all over body  . Dizziness and weakness   Immunizations Administered    Name Date Dose VIS Date Route   Pfizer COVID-19 Vaccine 09/11/2019  3:08 PM 0.3 mL 05/18/2019 Intramuscular   Manufacturer: [employer], [employer]   Lot: [id number]   NDC: [address]-9

## 2020-06-13 NOTE — Progress Notes (Signed)
Cardiology Office Note Date:  06/16/2020  Patient ID:  Nicole Frazier, DOB 1961/04/08, MRN [id number] PCP:  Patient, No Pcp Per  Cardiologist/Electrophysiologist: Dr. Graciela Husbands    Chief Complaint:   Evaluate BP  History of Present Illness: Nicole Frazier is a 60 y.o. female with history of HTN, SVT  She comes in to day t obe seen for Dr. Graciela Husbands.  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 She still did not want to have ablation.  Her BP was elevated and losartan/HCTZ  Also mentioned an ectopic atrial focus R LE swelling  TODAY She feels well. Mentions about a week ago she had an episode of SVT lasted about , resolved with vagal maneuver.  This is the first in a couple years. She has not been taking her BP medicines for about a year.  Says the last time she went for a refill mentioned that she needed an appt for more and then just never came in and did not pursue it. She had been monitoring her BP generally 170's-90-100 range, and again, just never got around to coming in. Her PMD retired and she is without one currently She saw her GYN 05/24/20 and recalls her BP being high and urged to follow up here but can not recall what it was, but was not ashigh as today's.  She denies today or otherwise any kind of CP, SOB, no dizzy spells, near syncope or syncope. No headaches, visual changes, or neurological symptoms.     Past Medical History:  Diagnosis Date  . Arthritis    knees  . Hypertension   . SVT (supraventricular tachycardia) (HCC)    recurrent    Past Surgical History:  Procedure Laterality Date  . COLONOSCOPY N/A 01/15/2016   Procedure: COLONOSCOPY;  Surgeon: Graylin Shiver, MD;  Location: [facility];  Service: Endoscopy;  Laterality: N/A;  . KNEE SURGERY  Bilateral yrs ago   arthroscopic    Current Outpatient Medications  Medication Sig Dispense Refill  . propranolol (INDERAL) 10 MG tablet Take one tablet (10 mg total) by mouth as needed for SVT. Take as directed by Dr. Graciela Husbands. 30 tablet 2  . labetalol (NORMODYNE) 200 MG tablet Take 1 tablet (200 mg total) by mouth 2 (two) times daily. 180 tablet 1  . losartan-hydrochlorothiazide (HYZAAR) 50-12.5 MG tablet Take 1 tablet by mouth daily. 90 tablet 3   No current facility-administered medications for this visit.    Allergies:   Patient has no known allergies.   Social History:  The patient  reports that she has never smoked. She has never used smokeless tobacco. She reports that she does not drink alcohol and does not use drugs.   Family History:  The patient's family history includes Hypertension in her brother, brother, father, mother, and sister.  ROS:  Please see the history of present illness.    All other systems are reviewed and otherwise negative.   PHYSICAL EXAM:  VS:  BP (!) 201/126   Pulse 81   Ht 5\' 6"  (1.676 m)   Wt (!) 345 lb (156.5 kg)   SpO2 90%   BMI 55.68 kg/m  BMI: Body mass index is 55.68 kg/m.  Manual recheck by myself with appropriately sized cuf is 208/108 and her O2 sat recheck is 98% Well nourished, well developed,  in no acute distress HEENT: normocephalic, atraumatic Neck: no JVD, carotid bruits or masses Cardiac:  RRR; no significant murmurs, no rubs, or gallops Lungs:  CTA b/l, no wheezing, rhonchi or rales Abd: soft, nontender, obese MS: no deformity or atrophy Ext: no edema Skin: warm and dry, no rash Neuro:  No gross deficits appreciated Psych: euthymic mood, full affect     EKG:  Done today and reviewed by myself shows  Low atrial rhythm, 81bpm, unchanged from prior    Recent Labs: No results found for requested labs within last 8760 hours.  No results found for requested labs within last 8760 hours.   CrCl cannot be calculated  (Patient's most recent lab result is older than the maximum 21 days allowed.).   Wt Readings from Last 3 Encounters:  06/16/20 (!) 345 lb (156.5 kg)  08/02/18 (!) 335 lb 6.4 oz (152.1 kg)  07/26/17 (!) 334 lb 3.2 oz (151.6 kg)     Other studies reviewed: Additional studies/records reviewed today include: summarized above  ASSESSMENT AND PLAN:  1. SVT     One brief episode in 2 years, resolved by vagal maneuver  2. HTN     OOC     Discussed at length with her importance of BP control  Discussed with DOD, given no symptoms and recheck by myself noted, can resume her previous medicines with close follow up     Restart her labetalol 200mg  BID and losartan/HCTZ 50/12.5mg  QD     BP clinic in 1-2 weeks     Echo doppler      MD/APP visit in a month, sooner if needed  Disposition: F/u as above  Current medicines are reviewed at length with the patient today.  The patient did not have any concerns regarding medicines.  Norma Fredrickson, PA-C 06/16/2020 1:17 PM     [facility] [address] [place] [address] [phone] (office)  [phone] (fax)

## 2020-06-16 ENCOUNTER — Other Ambulatory Visit: Payer: Self-pay

## 2020-06-16 ENCOUNTER — Encounter: Payer: Self-pay | Admitting: Physician Assistant

## 2020-06-16 ENCOUNTER — Ambulatory Visit: Payer: PRIVATE HEALTH INSURANCE | Admitting: Physician Assistant

## 2020-06-16 VITALS — BP 201/126 | HR 81 | Ht 66.0 in | Wt 345.0 lb

## 2020-06-16 DIAGNOSIS — I10 Essential (primary) hypertension: Secondary | ICD-10-CM | POA: Diagnosis not present

## 2020-06-16 DIAGNOSIS — I471 Supraventricular tachycardia: Secondary | ICD-10-CM

## 2020-06-16 MED ORDER — LOSARTAN POTASSIUM-HCTZ 50-12.5 MG PO TABS: 1.0000 | ORAL_TABLET | Freq: Every day | ORAL | 3 refills | Status: DC

## 2020-06-16 MED ORDER — LABETALOL HCL 200 MG PO TABS: 200.0000 mg | ORAL_TABLET | Freq: Two times a day (BID) | ORAL | 1 refills | Status: DC

## 2020-06-16 NOTE — Patient Instructions (Signed)
Medication Instructions:   Your physician recommends that you continue on your current medications as directed. Please refer to the Current Medication list given to you today.  *If you need a refill on your cardiac medications before your next appointment, please call your pharmacy*   Lab Work: NONE ORDERED  TODAY   If you have labs (blood work) drawn today and your tests are completely normal, you will receive your results only by: Marland Kitchen MyChart Message (if you have MyChart) OR . A paper copy in the mail If you have any lab test that is abnormal or we need to change your treatment, we will call you to review the results.   Testing/Procedures: Your physician has requested that you have an echocardiogram. Echocardiography is a painless test that uses sound waves to create images of your heart. It provides your doctor with information about the size and shape of your heart and how well your heart's chambers and valves are working. This procedure takes approximately one hour. There are no restrictions for this procedure.  Follow-Up: At [facility], you and your health needs are our priority.  As part of our continuing mission to provide you with exceptional heart care, we have created designated Provider Care Teams.  These Care Teams include your primary Cardiologist (physician) and Advanced Practice Providers (APPs -  Physician Assistants and Nurse Practitioners) who all work together to provide you with the care you need, when you need it.  We recommend signing up for the patient portal called "MyChart".  Sign up information is provided on this After Visit Summary.  MyChart is used to connect with patients for Virtual Visits (Telemedicine).  Patients are able to view lab/test results, encounter notes, upcoming appointments, etc.  Non-urgent messages can be sent to your provider as well.   To learn more about what you can do with MyChart, go to [email or web address].    Your next appointment:    WITH PHARM-D IN 1-2 WEEKS FOR BLOOD PRESSURE   1 month(s)  The format for your next appointment:   In Person  Provider:   Francis Dowse, PA-C   Other Instructions

## 2020-07-07 ENCOUNTER — Other Ambulatory Visit: Payer: Self-pay

## 2020-07-07 ENCOUNTER — Ambulatory Visit (INDEPENDENT_AMBULATORY_CARE_PROVIDER_SITE_OTHER): Payer: PRIVATE HEALTH INSURANCE | Admitting: Pharmacist

## 2020-07-07 ENCOUNTER — Ambulatory Visit (HOSPITAL_COMMUNITY): Payer: PRIVATE HEALTH INSURANCE | Attending: Cardiovascular Disease

## 2020-07-07 VITALS — BP 166/118 | HR 76 | Ht 66.0 in | Wt 345.0 lb

## 2020-07-07 DIAGNOSIS — I471 Supraventricular tachycardia: Secondary | ICD-10-CM | POA: Insufficient documentation

## 2020-07-07 DIAGNOSIS — Z6841 Body Mass Index (BMI) 40.0 and over, adult: Secondary | ICD-10-CM

## 2020-07-07 DIAGNOSIS — I10 Essential (primary) hypertension: Secondary | ICD-10-CM

## 2020-07-07 LAB — ECHOCARDIOGRAM COMPLETE
Area-P 1/2: 3.03 cm2
S' Lateral: 3.5 cm

## 2020-07-07 MED ORDER — CHLORTHALIDONE 25 MG PO TABS: 25.0000 mg | ORAL_TABLET | Freq: Every day | ORAL | 11 refills | Status: DC

## 2020-07-07 NOTE — Progress Notes (Signed)
Patient ID: Nicole Frazier                 DOB: 1961-02-03                      MRN: [id number]     HPI: Nicole Frazier is a 60 y.o. female patient of Dr Graciela Husbands referred by Francis Dowse, PA to HTN clinic. PMH is significant for HTN, SVT, and morbid obesity (BMI 56). She was last seen in clinic on 06/16/20 and her BP was elevated at 201/126. She had stopped taking her BP medications over a year ago because she needed an appt for refills and she never scheduled one. BP at home also elevated ranging 170-190s/100s. She was restarted on labetalol and losartan-HCTZ and scheduled in HTN clinic for follow up.  Pt presents today in good spirits. Reports stopping her losartan-HCTZ after about 1 week of therapy because it caused her feet to swell. States this also happened 2-3 years ago when she last took the med but she wasn't sure which medication had caused it. Tolerating labetalol without side effects. Cannot tell when her BP is high (no headache, blurred vision, etc). Has had 1 episode of palpitations in the past 3 weeks since her last visit, stopped after 2 minutes on its own. Usually has an episode about twice a year. Rarely uses NSAIDs, takes Tylenol if needed. Is under a lot of stress caring for her mother. Has been unsuccessful with weight loss efforts in the past.  Current HTN meds: labetalol 200mg  BID, losartan-HCTZ 50-12.5mg  daily - not taking, propranolol 10mg  prn SVT  Previously tried: swelling in her feet with losartan-HCTZ  BP goal: <130/3mmHg  Family History: The patient's family history includes Hypertension in her brother, brother, father, mother, and sister.  Social History: The patient  reports that she has never smoked. She has never used smokeless tobacco. She reports that she does not drink alcohol and does not use drugs.   Diet: At work by , doesn't always eat breakfast. Sometimes will have fruit. Lunch - grabs on the go. Dinner varies, her dad cooks. Avoids canned food, doesn't add  salt to food. 2 cups of coffee each day. Cut back to 2 sodas per week (used to drink more frequently).  Exercise: No formal exercise but stays busy with work and taking care of her mom.   Home BP readings: BP cuff delivered yesterday, hasn't started using yet  Wt Readings from Last 3 Encounters:  06/16/20 (!) 345 lb (156.5 kg)  08/02/18 (!) 335 lb 6.4 oz (152.1 kg)  07/26/17 (!) 334 lb 3.2 oz (151.6 kg)   BP Readings from Last 3 Encounters:  06/16/20 (!) 201/126  08/02/18 (!) 158/115  07/26/17 (!) 172/102   Pulse Readings from Last 3 Encounters:  06/16/20 81  08/02/18 (!) 156  07/26/17 87    Renal function: CrCl cannot be calculated (Patient's most recent lab result is older than the maximum 21 days allowed.).  Past Medical History:  Diagnosis Date  . Arthritis    knees  . Hypertension   . SVT (supraventricular tachycardia) (HCC)    recurrent    Current Outpatient Medications on File Prior to Visit  Medication Sig Dispense Refill  . labetalol (NORMODYNE) 200 MG tablet Take 1 tablet (200 mg total) by mouth 2 (two) times daily. 180 tablet 1  . losartan-hydrochlorothiazide (HYZAAR) 50-12.5 MG tablet Take 1 tablet by mouth daily. 90 tablet 3  . propranolol (INDERAL) 10 MG  tablet Take one tablet (10 mg total) by mouth as needed for SVT. Take as directed by Dr. Graciela Husbands. 30 tablet 2   No current facility-administered medications on file prior to visit.    No Known Allergies   Assessment/Plan:  1. Hypertension - BP remains elevated above goal <130/53mmHg. Will start chlorthalidone 25mg  daily and continue labetalol 200mg  BID. Recheck BP and BMET in 3 weeks. Pt will start monitoring her BP at home and bring cuff/log to next appt.   2. Obesity - BMI is 56, discussed trying weight loss med like Wegovy. Pt is interested. Will submit prior authorization to see if her insurance will cover it. Also discussed heart healthy diet and increasing physical activity.  Mariamawit Depaoli E. Khaila Velarde,  PharmD, BCACP, CPP [facility] [address] [place], [address] [place] Phone: [phone]; Fax: [phone] 07/07/2020 2:25 PM

## 2020-07-07 NOTE — Patient Instructions (Addendum)
It was nice to see you today  Your blood pressure goal is < 130/44mmHg  Start taking chlorthalidone 25mg  once daily  Continue taking your labetalol twice daily  Follow up for blood pressure check in 3 weeks. Please keep an eye on your blood pressure readings at home and bring in your readings along with your cuff to this visit.

## 2020-07-08 ENCOUNTER — Telehealth: Payer: Self-pay | Admitting: Pharmacist

## 2020-07-08 MED ORDER — SEMAGLUTIDE-WEIGHT MANAGEMENT 1 MG/0.5ML ~~LOC~~ SOAJ: 1.0000 mg | SUBCUTANEOUS | 0 refills | Status: DC

## 2020-07-08 MED ORDER — SEMAGLUTIDE-WEIGHT MANAGEMENT 2.4 MG/0.75ML ~~LOC~~ SOAJ: 2.4000 mg | SUBCUTANEOUS | 5 refills | Status: DC

## 2020-07-08 MED ORDER — SEMAGLUTIDE-WEIGHT MANAGEMENT 0.5 MG/0.5ML ~~LOC~~ SOAJ: 0.5000 mg | SUBCUTANEOUS | 0 refills | Status: DC

## 2020-07-08 MED ORDER — SEMAGLUTIDE-WEIGHT MANAGEMENT 1.7 MG/0.75ML ~~LOC~~ SOAJ: 1.7000 mg | SUBCUTANEOUS | 0 refills | Status: DC

## 2020-07-08 MED ORDER — SEMAGLUTIDE-WEIGHT MANAGEMENT 0.25 MG/0.5ML ~~LOC~~ SOAJ: 0.2500 mg | SUBCUTANEOUS | 0 refills | Status: DC

## 2020-07-08 NOTE — Telephone Encounter (Addendum)
[facility] prior authorization approved through 07/07/21. Rx titration panel sent to pharmacy. Spoke with pt and was able to activate copay card online:  ID [id number] BIN [id number] PCN [id number] GRP [id number]  Pt will bring first dose to next PharmD visit for counseling and first injection.

## 2020-07-27 NOTE — Progress Notes (Signed)
Cardiology Office Note Date:  07/27/2020  Patient ID:  Nicole Frazier, DOB 23-Aug-1960, MRN [id number] PCP:  Patient, No Pcp Per  Cardiologist/Electrophysiologist: Dr. Graciela Husbands    Chief Complaint:   Evaluate BP  History of Present Illness: Nicole Frazier is a 60 y.o. female with history of HTN, SVT  She comes in to day t obe seen for Dr. Graciela Husbands.  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 She still did not want to have ablation.  Her BP was elevated and losartan/HCTZ  Also mentioned an ectopic atrial focus R LE swelling  I saw her 06/16/20 She feels well. Mentions about a week ago she had an episode of SVT lasted about , resolved with vagal maneuver.  This is the first in a couple years. She has not been taking her BP medicines for about a year.  Says the last time she went for a refill mentioned that she needed an appt for more and then just never came in and did not pursue it. She had been monitoring her BP generally 170's-90-100 range, and again, just never got around to coming in. Her PMD retired and she is without one currently She saw her GYN 05/24/20 and recalls her BP being high and urged to follow up here but can not recall what it was, but was not ashigh as today's.  BP was OOC She denies today or otherwise any kind of CP, SOB, no dizzy spells, near syncope or syncope. No headaches, visual changes, or neurological symptoms. Labetalol restarted 200mg  BID, losartan/HCTZ 50/12.5 QD started and planned for BP clinic early follow up.  She saw the BP clinic, not taking losartan/HCTZ with reports of associated edema, chlorthalidone started   TODAY She is feeling well. Life has been very busy and distrupted with figuring out care for her parents with her siblings, though  seems to be settled mostly and feels like she can finally get back to taking care of herself. No CP, palpitations or cardiac awareness. No SOB, DOE No near syncope or syncope.  She is very happy with the BP clinic and ' care. She brought her BP cuff but the battery is low.  Past Medical History:  Diagnosis Date  . Arthritis    knees  . Hypertension   . SVT (supraventricular tachycardia) (HCC)    recurrent    Past Surgical History:  Procedure Laterality Date  . COLONOSCOPY N/A 01/15/2016   Procedure: COLONOSCOPY;  Surgeon: 03/16/2016, MD;  Location: [facility];  Service: Endoscopy;  Laterality: N/A;  . KNEE SURGERY Bilateral yrs ago   arthroscopic    Current Outpatient Medications  Medication Sig Dispense Refill  . chlorthalidone (HYGROTON) 25 MG tablet Take 1 tablet (25 mg total) by mouth daily. 30 tablet 11  . labetalol (NORMODYNE) 200 MG tablet Take 1 tablet (200 mg total) by mouth 2 (two) times daily. 180 tablet 1  . propranolol (INDERAL) 10 MG tablet Take one tablet (10 mg total) by mouth as needed for SVT. Take as directed by Dr. Graylin Shiver. 30 tablet 2  . Semaglutide-Weight Management 0.25 MG/0.5ML SOAJ Inject 0.25 mg into the skin once a week for 28 days. 2 mL 0  . [START ON 08/06/2020] Semaglutide-Weight Management 0.5 MG/0.5ML SOAJ Inject 0.5 mg into the skin once a week for 28 days.  2 mL 0  . [START ON 09/04/2020] Semaglutide-Weight Management 1 MG/0.5ML SOAJ Inject 1 mg into the skin once a week for 28 days. 2 mL 0  . [START ON 10/03/2020] Semaglutide-Weight Management 1.7 MG/0.75ML SOAJ Inject 1.7 mg into the skin once a week for 28 days. 3 mL 0  . [START ON 11/01/2020] Semaglutide-Weight Management 2.4 MG/0.75ML SOAJ Inject 2.4 mg into the skin once a week. 3 mL 5   No current facility-administered medications for this visit.    Allergies:   Patient has no known allergies.   Social History:  The patient  reports that she has never smoked. She has never used  smokeless tobacco. She reports that she does not drink alcohol and does not use drugs.   Family History:  The patient's family history includes Hypertension in her brother, brother, father, mother, and sister.  ROS:  Please see the history of present illness.    All other systems are reviewed and otherwise negative.   PHYSICAL EXAM:  VS:  There were no vitals taken for this visit. BMI: There is no height or weight on file to calculate BMI.   Well nourished, well developed, in no acute distress HEENT: normocephalic, atraumatic Neck: no JVD, carotid bruits or masses Cardiac:  RRR; no significant murmurs, no rubs, or gallops Lungs:   CTA b/l, no wheezing, rhonchi or rales Abd: soft, nontender, obese MS: no deformity or atrophy Ext:  no edema Skin: warm and dry, no rash Neuro:  No gross deficits appreciated Psych: euthymic mood, full affect     EKG:  Done today and reviewed by myself shows  06/16/20: Low atrial rhythm, 81bpm, unchanged from prior  07/07/20: TTE IMPRESSIONS  1. Left ventricular ejection fraction, by estimation, is 60 to 65%. The  left ventricle has normal function. The left ventricle has no regional  wall motion abnormalities. Left ventricular diastolic parameters were  normal.  2. Right ventricular systolic function is normal. The right ventricular  size is normal.  3. Left atrial size was moderately dilated.  4. The mitral valve is abnormal. Trivial mitral valve regurgitation. No  evidence of mitral stenosis. Moderate mitral annular calcification.  5. The aortic valve is tricuspid. There is moderate calcification of the  aortic valve. There is moderate thickening of the aortic valve. Aortic  valve regurgitation is not visualized. Mild to moderate aortic valve  sclerosis/calcification is present,  without any evidence of aortic stenosis.  6. The inferior vena cava is normal in size with greater than 50%  respiratory variability, suggesting right atrial  pressure of 3 mmHg.    Recent Labs: No results found for requested labs within last 8760 hours.  No results found for requested labs within last 8760 hours.   CrCl cannot be calculated (Patient's most recent lab result is older than the maximum 21 days allowed.).   Wt Readings from Last 3 Encounters:  07/07/20 (!) 345 lb (156.5 kg)  06/16/20 (!) 345 lb (156.5 kg)  08/02/18 (!) 335 lb 6.4 oz (152.1 kg)     Other studies reviewed: Additional studies/records reviewed today include: summarized above  ASSESSMENT AND PLAN:  1. SVT     One brief episode in 2 years, resolved by vagal maneuver  2. HTN     Still OOC     In d/w RPH, the patient has been very reluctant/waery to make medication changes, was agreeable to add small dose of amlodipine today, he will see her in a month, labs done today.  She is looking forward to starting dance class again, this she loved and gave her good exercise.   Disposition:I will see her back in 45mo, if BP controlled and doing well, can push that out to a year  Current medicines are reviewed at length with the patient today.  The patient did not have any concerns regarding medicines.  Norma Fredrickson, PA-C 07/27/2020 10:06 AM     [facility] [address] [place] [address] [phone] (office)  [phone] (fax)

## 2020-07-28 ENCOUNTER — Ambulatory Visit (INDEPENDENT_AMBULATORY_CARE_PROVIDER_SITE_OTHER): Payer: PRIVATE HEALTH INSURANCE | Admitting: Pharmacist

## 2020-07-28 ENCOUNTER — Other Ambulatory Visit: Payer: Self-pay

## 2020-07-28 ENCOUNTER — Encounter: Payer: Self-pay | Admitting: Pharmacist

## 2020-07-28 ENCOUNTER — Encounter: Payer: Self-pay | Admitting: Physician Assistant

## 2020-07-28 ENCOUNTER — Ambulatory Visit: Payer: PRIVATE HEALTH INSURANCE | Admitting: Physician Assistant

## 2020-07-28 VITALS — BP 162/92 | HR 73 | Ht 66.0 in | Wt 338.0 lb

## 2020-07-28 VITALS — BP 162/92 | HR 73 | Wt 338.6 lb

## 2020-07-28 DIAGNOSIS — I10 Essential (primary) hypertension: Secondary | ICD-10-CM

## 2020-07-28 DIAGNOSIS — Z6841 Body Mass Index (BMI) 40.0 and over, adult: Secondary | ICD-10-CM

## 2020-07-28 DIAGNOSIS — I471 Supraventricular tachycardia: Secondary | ICD-10-CM | POA: Diagnosis not present

## 2020-07-28 MED ORDER — AMLODIPINE BESYLATE 2.5 MG PO TABS: 2.5000 mg | ORAL_TABLET | Freq: Every day | ORAL | 3 refills | Status: DC

## 2020-07-28 MED ORDER — AMLODIPINE BESYLATE 2.5 MG PO TABS: 2.5000 mg | ORAL_TABLET | Freq: Every day | ORAL | 1 refills | Status: DC

## 2020-07-28 NOTE — Patient Instructions (Addendum)
It was nice meeting you today!  We would like your blood pressure to be less than 130/80  Please continue your labetalol 200mg  twice a day and your chlorthalidone 25 mg in the morning  We are going to add a low dose of amlodipine 2.5mg  once a day  Please call if you have any side effects  Try to follow a heart healthy diet:  Lean proteins, vegetables, whole grains, low salt and avoid sugars  We will call you with your lab results and see you back next month  , PharmD, BCACP, CDCES, CPP [facility] [address], [place], [place] Phone: [phone]; Fax: [phone] 07/28/2020 3:01 PM

## 2020-07-28 NOTE — Patient Instructions (Addendum)
Medication Instructions:   Your physician recommends that you continue on your current medications as directed. Please refer to the Current Medication list given to you today.  *If you need a refill on your cardiac medications before your next appointment, please call your pharmacy*   Lab Work: NONE ORDERED  TODAY   If you have labs (blood work) drawn today and your tests are completely normal, you will receive your results only by: Marland Kitchen MyChart Message (if you have MyChart) OR . A paper copy in the mail If you have any lab test that is abnormal or we need to change your treatment, we will call you to review the results.   Testing/Procedures:    Follow-Up: At [facility], you and your health needs are our priority.  As part of our continuing mission to provide you with exceptional heart care, we have created designated Provider Care Teams.  These Care Teams include your primary Cardiologist (physician) and Advanced Practice Providers (APPs -  Physician Assistants and Nurse Practitioners) who all work together to provide you with the care you need, when you need it.  We recommend signing up for the patient portal called "MyChart".  Sign up information is provided on this After Visit Summary.  MyChart is used to connect with patients for Virtual Visits (Telemedicine).  Patients are able to view lab/test results, encounter notes, upcoming appointments, etc.  Non-urgent messages can be sent to your provider as well.   To learn more about what you can do with MyChart, go to [email or web address].    Your next appointment:   6 month(s)  The format for your next appointment:   In Person  Provider:   Francis Dowse, PA-C   Other Instructions

## 2020-07-28 NOTE — Progress Notes (Signed)
Patient ID: Nicole Frazier                 DOB: Apr 07, 1961                      MRN: [id number]     HPI: Nicole Frazier is a 60 y.o. female referred by Dr. Francis Dowse to HTN clinic. PMH is significant for HTN, SVT, Pre DM, and morbid obesity (BMI 56). She was last seen in clinic on 06/16/20 and her BP was elevated at 201/126. She had stopped taking her BP medications over a year ago because she needed an appt for refills and she never scheduled one. BP at home also elevated ranging 170-190s/100s. She was restarted on labetalol and losartan-HCTZ and scheduled in HTN clinic.  At last PharmD visit, losartan/hctz was changed to chlorthalidone.  Patient presents today in good spirits.  Has lost weight in past month but she believes that is related to stress from caring for her mother.  Her son has lost a lot of weight in the past year and is going to start cooking for her.  Is tolerating chlorthalidone and HCTZ.  Believes that many years ago losartan/hctz may have caused a gout flair.  Is very nervous of lower extremity edema.  Denies chest pain, dizziness, SOB or swelling.  Has been monitoring blood pressure daily and has begun to slowly trend down.  2/7: 206/116 2/8: 170/113 2/9: 209/130 (no meds)  160/94 (with meds) 2/10: 172/100 2/11: 184/118 2/13: 168/116 2/14: 164/111 2/15: 177/119 2/17: 142/103 2/18: 134/85  Is also here for [facility] training to aid in weight loss.  Current HTN meds: chlorthalidone 25mg , labetalol 200 mg BID,  Previously tried: lisinopril/HCTZ BP goal: <130/80  Wt Readings from Last 3 Encounters:  07/07/20 (!) 345 lb (156.5 kg)  06/16/20 (!) 345 lb (156.5 kg)  08/02/18 (!) 335 lb 6.4 oz (152.1 kg)   BP Readings from Last 3 Encounters:  07/07/20 (!) 166/118  06/16/20 (!) 201/126  08/02/18 (!) 158/115   Pulse Readings from Last 3 Encounters:  07/07/20 76  06/16/20 81  08/02/18 (!) 156    Renal function: CrCl cannot be calculated (Patient's most recent lab  result is older than the maximum 21 days allowed.).  Past Medical History:  Diagnosis Date  . Arthritis    knees  . Hypertension   . SVT (supraventricular tachycardia) (HCC)    recurrent    Current Outpatient Medications on File Prior to Visit  Medication Sig Dispense Refill  . chlorthalidone (HYGROTON) 25 MG tablet Take 1 tablet (25 mg total) by mouth daily. 30 tablet 11  . labetalol (NORMODYNE) 200 MG tablet Take 1 tablet (200 mg total) by mouth 2 (two) times daily. 180 tablet 1  . propranolol (INDERAL) 10 MG tablet Take one tablet (10 mg total) by mouth as needed for SVT. Take as directed by Dr. 08/04/18. 30 tablet 2  . Semaglutide-Weight Management 0.25 MG/0.5ML SOAJ Inject 0.25 mg into the skin once a week for 28 days. 2 mL 0  . [START ON 08/06/2020] Semaglutide-Weight Management 0.5 MG/0.5ML SOAJ Inject 0.5 mg into the skin once a week for 28 days. 2 mL 0  . [START ON 09/04/2020] Semaglutide-Weight Management 1 MG/0.5ML SOAJ Inject 1 mg into the skin once a week for 28 days. 2 mL 0  . [START ON 10/03/2020] Semaglutide-Weight Management 1.7 MG/0.75ML SOAJ Inject 1.7 mg into the skin once a week for 28 days. 3 mL 0  . [  START ON 11/01/2020] Semaglutide-Weight Management 2.4 MG/0.75ML SOAJ Inject 2.4 mg into the skin once a week. 3 mL 5   No current facility-administered medications on file prior to visit.    No Known Allergies   Assessment/Plan:  1. Hypertension - BP in room 162/92 which is above goal of <130/90 although this has improved in past month.  Patient needs further pharmacologic therapy to lower BP as well as weight loss.  Discussed pros and cons of CCB/ACEi/ARB and patient willing to try low dose amlodipine because she knows her mother was on it.  She is very nervous of swelling however and will call if she experiences any.  Discussed weight loss with patient and the positive impact it should have on BP control.  However, patient is no longer willing to try [facility] because her  son lost weight through diet and exercise and reports he will teach her.  Does not want to take any other medications.  Would like to lose weight "naturally." Counseled on MOA of Wegovy and how it slows digestion, causing her to eat less.  Patient voiced knowledge of this but still want to try weight loss with diet first.    Placed order for CMP since labs have not been updated in over a year.  Recheck in 4 weeks.  Continue chlorthlalidone 25mg  daily Continue labetalol 200 mg BID Start amlodipine 2.5mg  once daily Recheck in 4 weeks.  , PharmD, BCACP, CDCES, CPP [facility] [address], [place], [place] Phone: [phone]; Fax: [phone] 07/28/2020 3:40 PM

## 2020-07-29 LAB — COMPREHENSIVE METABOLIC PANEL
ALT: 2 IU/L (ref 0–32)
AST: 13 IU/L (ref 0–40)
Albumin/Globulin Ratio: 1.4 (ref 1.2–2.2)
Albumin: 4.5 g/dL (ref 3.8–4.9)
Alkaline Phosphatase: 105 IU/L (ref 44–121)
BUN/Creatinine Ratio: 16 (ref 12–28)
BUN: 15 mg/dL (ref 8–27)
Bilirubin Total: 0.3 mg/dL (ref 0.0–1.2)
CO2: 26 mmol/L (ref 20–29)
Calcium: 9.8 mg/dL (ref 8.7–10.3)
Chloride: 97 mmol/L (ref 96–106)
Creatinine, Ser: 0.92 mg/dL (ref 0.57–1.00)
GFR calc Af Amer: 78 mL/min/{1.73_m2} (ref >59)
GFR calc non Af Amer: 68 mL/min/{1.73_m2} (ref >59)
Globulin, Total: 3.2 g/dL (ref 1.5–4.5)
Glucose: 102 mg/dL — ABNORMAL HIGH (ref 65–99)
Potassium: 3.6 mmol/L (ref 3.5–5.2)
Sodium: 138 mmol/L (ref 134–144)
Total Protein: 7.7 g/dL (ref 6.0–8.5)

## 2020-07-29 NOTE — Addendum Note (Signed)
Addended by: Kelley Knoth E on: 07/29/2020 07:47 AM   Modules accepted: Orders

## 2020-07-30 ENCOUNTER — Telehealth: Payer: Self-pay | Admitting: Pharmacist

## 2020-07-30 NOTE — Telephone Encounter (Signed)
Spoke with patient and relayed lab results stable.  Patient voiced understanding.

## 2020-08-06 ENCOUNTER — Ambulatory Visit: Payer: PRIVATE HEALTH INSURANCE | Admitting: Internal Medicine

## 2020-08-25 ENCOUNTER — Encounter: Payer: Self-pay | Admitting: Pharmacist

## 2020-08-25 ENCOUNTER — Ambulatory Visit (INDEPENDENT_AMBULATORY_CARE_PROVIDER_SITE_OTHER): Payer: PRIVATE HEALTH INSURANCE | Admitting: Pharmacist

## 2020-08-25 ENCOUNTER — Other Ambulatory Visit: Payer: Self-pay

## 2020-08-25 VITALS — BP 162/108 | HR 91

## 2020-08-25 DIAGNOSIS — I10 Essential (primary) hypertension: Secondary | ICD-10-CM

## 2020-08-25 MED ORDER — CHLORTHALIDONE 25 MG PO TABS: 37.5000 mg | ORAL_TABLET | Freq: Every day | ORAL | 2 refills | Status: DC

## 2020-08-25 NOTE — Patient Instructions (Addendum)
It was nice seeing you again!  We would like to have your blood pressure be less than 130/80  Continue amlodipine 2.5mg  daily Continue labetalol 200mg  twice a day  We will increase your chlorthalidone to 37.5mg  once daily.  You will take 1 and 1/2 tablets once a day  We will check your lab work in about 2 weeks  Please call if you do not think your swelling is going down or with any other questions or concerns.  , PharmD, BCACP, CDCES, CPP [facility] [address], [place], [place] Phone: [phone]; Fax: [phone] 08/25/2020 4:23 PM

## 2020-08-25 NOTE — Progress Notes (Signed)
Patient ID: Nicole Frazier                 DOB: Jun 13, 1960                      MRN: [id number]     HPI: Nicole Frazier is a 60 y.o. female referred by Clementeen Hoof to HTN clinic.  PMH is significant for HTN, SVT, Pre DM, and morbid obesity (BMI 56). She was last seen in clinic on 06/16/20 and her BP was elevated at 201/126 when she rean out of refills on her medication.  At first visit to HTN clinic, chlorthalidone was started and at second visit patient started on low dose amlodipine.    Patient presents today in good spirits. Reports she had no issues with low dose amlodipine until today when she noticed her feet had swollen. Does not know how long they've been swollen since she rarely looks at them.  Not painful.  Reports compliance with medications in morning but will occasionally forget evening dose of labetalol.  Does not like taking medications and continues to worry about side effects  Uses a wrist cuff and readings have been elevated: 170/98, 153/97, 160/94.    Continues to care for her mother who may have had a stroke.  On April 1st her sisters will be able to help her and she thinks this will considerably lessen her stress levels at home.  Son is going to help her with cooking and diet planning.  Current HTN meds: chlorthalidone 25mg  daily, Labetalol 200mg  BID, amlodipine 2.5mg  daily Previously tried: (possible gout) BP goal: <130/80  Wt Readings from Last 3 Encounters:  07/28/20 (!) 338 lb (153.3 kg)  07/28/20 (!) 338 lb 9.6 oz (153.6 kg)  07/07/20 (!) 345 lb (156.5 kg)   BP Readings from Last 3 Encounters:  07/28/20 (!) 162/92  07/28/20 (!) 162/92  07/07/20 (!) 166/118   Pulse Readings from Last 3 Encounters:  07/28/20 73  07/28/20 73  07/07/20 76    Renal function: CrCl cannot be calculated (Patient's most recent lab result is older than the maximum 21 days allowed.).  Past Medical History:  Diagnosis Date  . Arthritis    knees  . Hypertension   .  SVT (supraventricular tachycardia) (HCC)    recurrent    Current Outpatient Medications on File Prior to Visit  Medication Sig Dispense Refill  . amLODipine (NORVASC) 2.5 MG tablet Take 1 tablet (2.5 mg total) by mouth daily. 90 tablet 3  . chlorthalidone (HYGROTON) 25 MG tablet Take 1 tablet (25 mg total) by mouth daily. 30 tablet 11  . labetalol (NORMODYNE) 200 MG tablet Take 1 tablet (200 mg total) by mouth 2 (two) times daily. 180 tablet 1  . propranolol (INDERAL) 10 MG tablet Take one tablet (10 mg total) by mouth as needed for SVT. Take as directed by Dr. 07/30/20. 30 tablet 2   No current facility-administered medications on file prior to visit.    No Known Allergies   Assessment/Plan:  1. Hypertension - Patient BP in room today 162/108 which is above goal of <130/80 and is actually higher than at last visit despite amlodipine.  Unclear if patient's swollen feet is due to amlodipine since she has not been paying attention to them.  Recommended she remain on amlodipine for now to see if swelling goes away. If not, we can d/c amlodipine.  Gave patient options of increasing chlorthalidone and/or adding on an ARB.  Patient  does not want to start a new medication so will increase chlorthalidone to 37.5mg  once daily.  Patient voiced understanding.  Recheck in 4 weeks.  Continue labetalol 200mg  BID Continue amlodipine 2.5mg  daily Increase chlorthalidone to 37.5mg  once a day Check BMP in 2 weeks Recheck in 4 weeks  , PharmD, BCACP, CDCES, CPP [facility] [address] [place], [place] Phone: [phone]; Fax: [phone] 08/25/2020 4:48 PM

## 2020-09-09 ENCOUNTER — Other Ambulatory Visit: Payer: Self-pay

## 2020-09-09 ENCOUNTER — Other Ambulatory Visit: Payer: PRIVATE HEALTH INSURANCE

## 2020-09-09 DIAGNOSIS — I10 Essential (primary) hypertension: Secondary | ICD-10-CM

## 2020-09-10 ENCOUNTER — Other Ambulatory Visit: Payer: Self-pay | Admitting: *Deleted

## 2020-09-10 DIAGNOSIS — E876 Hypokalemia: Secondary | ICD-10-CM

## 2020-09-10 LAB — BASIC METABOLIC PANEL
BUN/Creatinine Ratio: 18 (ref 12–28)
BUN: 15 mg/dL (ref 8–27)
CO2: 28 mmol/L (ref 20–29)
Calcium: 9.8 mg/dL (ref 8.7–10.3)
Chloride: 96 mmol/L (ref 96–106)
Creatinine, Ser: 0.83 mg/dL (ref 0.57–1.00)
Glucose: 94 mg/dL (ref 65–99)
Potassium: 3.3 mmol/L — ABNORMAL LOW (ref 3.5–5.2)
Sodium: 140 mmol/L (ref 134–144)
eGFR: 81 mL/min/{1.73_m2} (ref >59)

## 2020-09-23 ENCOUNTER — Other Ambulatory Visit: Payer: PRIVATE HEALTH INSURANCE

## 2020-09-23 ENCOUNTER — Ambulatory Visit: Payer: PRIVATE HEALTH INSURANCE

## 2020-09-23 NOTE — Progress Notes (Deleted)
Patient ID: Nicole Frazier                 DOB: 1961/03/09                      MRN: [id number]     HPI: Nicole Frazier is a 60 y.o. female referred by Dr. Clementeen Hoof to HTN clinic.PMH is significant for HTN, SVT, Pre DM, and morbid obesity (BMI 56). She was last seen in clinic on 06/16/20 and her BP was elevated at 201/126. She had stopped taking her BP medications over a year ago because she needed an appt for refills and she never scheduled one. BP at home also elevated ranging 170-190s/100s. She was restarted on labetalol and losartan-HCTZ and scheduled in HTN clinic.  At last PharmD visit, losartan/hctz was changed to chlorthalidone  Current HTN meds:  Previously tried:  BP goal:   Family History:   Social History:   Diet:   Exercise:   Home BP readings:   Wt Readings from Last 3 Encounters:  07/28/20 (!) 338 lb (153.3 kg)  07/28/20 (!) 338 lb 9.6 oz (153.6 kg)  07/07/20 (!) 345 lb (156.5 kg)   BP Readings from Last 3 Encounters:  08/25/20 (!) 162/108  07/28/20 (!) 162/92  07/28/20 (!) 162/92   Pulse Readings from Last 3 Encounters:  08/25/20 91  07/28/20 73  07/28/20 73    Renal function: CrCl cannot be calculated (Unknown ideal weight.).  Past Medical History:  Diagnosis Date  . Arthritis    knees  . Hypertension   . SVT (supraventricular tachycardia) (HCC)    recurrent    Current Outpatient Medications on File Prior to Visit  Medication Sig Dispense Refill  . amLODipine (NORVASC) 2.5 MG tablet Take 1 tablet (2.5 mg total) by mouth daily. 90 tablet 3  . chlorthalidone (HYGROTON) 25 MG tablet Take 1.5 tablets (37.5 mg total) by mouth daily. 45 tablet 2  . labetalol (NORMODYNE) 200 MG tablet Take 1 tablet (200 mg total) by mouth 2 (two) times daily. 180 tablet 1  . propranolol (INDERAL) 10 MG tablet Take one tablet (10 mg total) by mouth as needed for SVT. Take as directed by Dr. Graciela Husbands. 30 tablet 2   No current facility-administered medications on file prior  to visit.    No Known Allergies   Assessment/Plan:  1. Hypertension -

## 2020-09-30 ENCOUNTER — Other Ambulatory Visit: Payer: PRIVATE HEALTH INSURANCE | Admitting: *Deleted

## 2020-09-30 ENCOUNTER — Other Ambulatory Visit: Payer: Self-pay

## 2020-09-30 DIAGNOSIS — E876 Hypokalemia: Secondary | ICD-10-CM

## 2020-09-30 LAB — BASIC METABOLIC PANEL
BUN/Creatinine Ratio: 16 (ref 12–28)
BUN: 15 mg/dL (ref 8–27)
CO2: 31 mmol/L — ABNORMAL HIGH (ref 20–29)
Calcium: 9.6 mg/dL (ref 8.7–10.3)
Chloride: 94 mmol/L — ABNORMAL LOW (ref 96–106)
Creatinine, Ser: 0.95 mg/dL (ref 0.57–1.00)
Glucose: 92 mg/dL (ref 65–99)
Potassium: 3.1 mmol/L — ABNORMAL LOW (ref 3.5–5.2)
Sodium: 140 mmol/L (ref 134–144)
eGFR: 69 mL/min/{1.73_m2} (ref >59)

## 2020-10-01 ENCOUNTER — Telehealth: Payer: Self-pay | Admitting: *Deleted

## 2020-10-01 DIAGNOSIS — E876 Hypokalemia: Secondary | ICD-10-CM

## 2020-10-01 NOTE — Telephone Encounter (Signed)
Left the pt a message to call the office back and ask to speak with a triage nurse.  This is to encourage her to start taking KDUR 20 mEq po daily and repeat another BMET in 10 days or on same day as she comes to see the Pharmacist, on 5/10.  Pts K level is dropping, and Francis Dowse PA-C advised her to start taking KDUR 20 mEq po daily back on 4/6 lab results, and pt declined this.  Her K level know is even lower. Triage to encourage pt to start taking supplementation, when she returns a call back.   Will route this call to triage and to [facility], for further review and follow-up.

## 2020-10-01 NOTE — Telephone Encounter (Signed)
Loa Socks, LPN  10/08/5463 68:12 AM EDT Back to Top     Pt K level continues to drop. She was advised by Francis Dowse PA-C at last BMET check to start taking KDUR 20 mEq po daily and repeat BMET in 10 days, which is this result. Pt declined to start taking KDUR at that time, and K level is reflecting so. Will encourage pt to start taking supplementation, as advised by Renee at on 4/6 lab result.  Will also encourage her to come in for another repeat BMET in 10 days, to follow this.   Preliminarily reviewed. Forwarded to MD desktop for review and signature.

## 2020-10-02 MED ORDER — POTASSIUM CHLORIDE ER 10 MEQ PO TBCR: 20.0000 meq | EXTENDED_RELEASE_TABLET | Freq: Every day | ORAL | 3 refills | Status: DC

## 2020-10-02 NOTE — Telephone Encounter (Signed)
Pt returning call.  Advised that potassium is lower than last check.  Provided education on importance of potassium related to heart rhythms.  Pt agreeable to start potassium.  Prefers to try 10 meq potassium 2 tablets by mouth daily.  Advised will repeat lab work at her next appt 5/10.  Orders placed.

## 2020-10-02 NOTE — Addendum Note (Signed)
Addended by: Roney Mans A on: 10/02/2020 09:09 AM   Modules accepted: Orders

## 2020-10-14 ENCOUNTER — Ambulatory Visit (INDEPENDENT_AMBULATORY_CARE_PROVIDER_SITE_OTHER): Payer: PRIVATE HEALTH INSURANCE | Admitting: Pharmacist

## 2020-10-14 ENCOUNTER — Other Ambulatory Visit: Payer: Self-pay

## 2020-10-14 ENCOUNTER — Other Ambulatory Visit: Payer: PRIVATE HEALTH INSURANCE

## 2020-10-14 ENCOUNTER — Other Ambulatory Visit: Payer: PRIVATE HEALTH INSURANCE | Admitting: *Deleted

## 2020-10-14 VITALS — BP 132/84 | HR 85 | Wt 340.0 lb

## 2020-10-14 DIAGNOSIS — Z6841 Body Mass Index (BMI) 40.0 and over, adult: Secondary | ICD-10-CM

## 2020-10-14 DIAGNOSIS — I10 Essential (primary) hypertension: Secondary | ICD-10-CM

## 2020-10-14 DIAGNOSIS — E876 Hypokalemia: Secondary | ICD-10-CM

## 2020-10-14 NOTE — Progress Notes (Signed)
Patient ID: Nicole Frazier                 DOB: 1960/07/13                      MRN: [id number]     HPI: Nicole Frazier is a 60 y.o. female patient of Dr Graciela Husbands referred by Francis Dowse, PA to HTN clinic. PMH is significant for HTN, SVT, and morbid obesity (BMI 56). Home BP earlier this year were elevated ranging 170-190s/100s. She was restarted on labetalol and losartan-HCTZ and scheduled in HTN clinic for follow up. I saw pt in January when she reported swelling in her feet with losartan-HCTZ. I started pt on chlorthalidone 25mg  daily for BP and swelling. Also discussed trying [facility] for weight loss which pt was agreeable to. At next visit, amlodipine 2.5mg  was started and pt did not wish to try [facility], instead preferring to focus on diet and exercise which her son successfully lost weight with. BP remained elevated at 162/108 at follow up visit in March. Pt did not wish to start new medication and she did report some LEE, so her chlorthalidone was increased to 37.5mg . However, this caused hypokalemia with K down to 3.3. Pt advised to start K supplementation but she did not want to add a new pill. Repeat K even lower at 3.1  Pt presents today in good spirits. Reports tolerating all medications well. Denies dizziness, headache, LEE, or vision changes. Stress level has decreased since her 2 sisters are helping to care for her mother now. Son has been helping with cooking and diet planning on and off. He lost weight by cutting back on sugar and carbs in his diet. Pt doesn't eat fast food. Has been cutting back on eating out at restaurants. Did pick up [facility] but never started. Has had 1 episode of palpitations a few weeks ago that resolved on its own in a few minutes. Rarely uses NSAIDs, takes Tylenol if needed. Is under a lot of stress caring for her mother.   Checks BP at home on her left wrist. 155/98 this AM, usually runs in the low 150s/90-98. Has a bicep cuff at her mom's that she'll use occasionally,  readings are similar.  Current HTN meds: labetalol 200mg  BID, amlodipine 2.5mg  daily, chlorthalidone 37.5mg  daily, propranolol 10mg  prn SVT  Previously tried: losartan-HCTZ - swelling in her feet  BP goal: <130/8mmHg  Family History: The patient's family history includes Hypertension in her brother, brother, father, mother, and sister.  Social History: The patient  reports that she has never smoked. She has never used smokeless tobacco. She reports that she does not drink alcohol and does not use drugs.   Diet: At work by , doesn't always eat breakfast. Sometimes will have fruit. Lunch - grabs on the go. Dinner varies, her dad cooks. Avoids canned food, doesn't add salt to food. 2 cups of coffee each day. Cut back to 2 sodas per week (used to drink more frequently).  Exercise: No formal exercise but stays busy with work and taking care of her mom.   Home BP readings: BP cuff delivered yesterday, hasn't started using yet  Wt Readings from Last 3 Encounters:  07/28/20 (!) 338 lb (153.3 kg)  07/28/20 (!) 338 lb 9.6 oz (153.6 kg)  07/07/20 (!) 345 lb (156.5 kg)   BP Readings from Last 3 Encounters:  08/25/20 (!) 162/108  07/28/20 (!) 162/92  07/28/20 (!) 162/92   Pulse Readings from Last  3 Encounters:  08/25/20 91  07/28/20 73  07/28/20 73    Renal function: CrCl cannot be calculated (Unknown ideal weight.).  Past Medical History:  Diagnosis Date  . Arthritis    knees  . Hypertension   . SVT (supraventricular tachycardia) (HCC)    recurrent    Current Outpatient Medications on File Prior to Visit  Medication Sig Dispense Refill  . amLODipine (NORVASC) 2.5 MG tablet Take 1 tablet (2.5 mg total) by mouth daily. 90 tablet 3  . chlorthalidone (HYGROTON) 25 MG tablet Take 1.5 tablets (37.5 mg total) by mouth daily. 45 tablet 2  . labetalol (NORMODYNE) 200 MG tablet Take 1 tablet (200 mg total) by mouth 2 (two) times daily. 180 tablet 1  . potassium chloride (KLOR-CON) 10  MEQ tablet Take 2 tablets (20 mEq total) by mouth daily. 180 tablet 3  . propranolol (INDERAL) 10 MG tablet Take one tablet (10 mg total) by mouth as needed for SVT. Take as directed by Dr. Graciela Husbands. 30 tablet 2   No current facility-administered medications on file prior to visit.    No Known Allergies   Assessment/Plan:  1. Hypertension - BP in clinic has improved notable and is very close to goal <130/23mmHg. Checking BMET today with recent K supplementation start. Will continue chlorthalidone 37.5mg  daily, amlodipine 2.5mg  daily, and labetalol 200mg  BID. Recheck BP in 4 weeks. Pt advised to bring home wrist cuff to visit since her home readings have been running much higher than clinic reading today.  2. Obesity - Encouraged pt to eat heart healthy diet and exercise as able. Previously discussed which pt was approved for and picked up from pharmacy but she hadn't started taking it. Revisited this again today, pt thinks she may start on injections. Will get an update at next visit in 1 month. Discussed that weight loss will also help to improve her BP.  [name], PharmD, BCACP, CPP [facility] [address], [place], [place] Phone: [phone]; Fax: [phone] 10/14/2020 10:22 AM

## 2020-10-14 NOTE — Patient Instructions (Addendum)
It was nice to see you today  Your blood pressure goal is < 130/34mmHg  Continue taking your current medications  Monitor your blood pressure at home. Please bring in your cuff and readings to your next visit in 1 month

## 2020-10-15 LAB — BASIC METABOLIC PANEL
BUN/Creatinine Ratio: 14 (ref 12–28)
BUN: 12 mg/dL (ref 8–27)
CO2: 27 mmol/L (ref 20–29)
Calcium: 9.7 mg/dL (ref 8.7–10.3)
Chloride: 99 mmol/L (ref 96–106)
Creatinine, Ser: 0.85 mg/dL (ref 0.57–1.00)
Glucose: 100 mg/dL — ABNORMAL HIGH (ref 65–99)
Potassium: 3.6 mmol/L (ref 3.5–5.2)
Sodium: 141 mmol/L (ref 134–144)
eGFR: 78 mL/min/{1.73_m2} (ref >59)

## 2020-11-11 ENCOUNTER — Ambulatory Visit: Payer: PRIVATE HEALTH INSURANCE

## 2020-11-11 NOTE — Progress Notes (Deleted)
Patient ID: Nicole Frazier                 DOB: Jul 02, 1960                      MRN: [id number]     HPI: Nicole Frazier is a 60 y.o. female patient of Dr Graciela Husbands referred by Francis Dowse, PA to HTN clinic. PMH is significant for HTN, SVT, and morbid obesity (BMI 56). Home BP earlier this year were elevated ranging 170-190s/100s. She was restarted on labetalol and losartan-HCTZ and scheduled in HTN clinic for follow up. I saw pt in January when she reported swelling in her feet with losartan-HCTZ. I started pt on chlorthalidone 25mg  daily for BP and swelling. Also discussed trying [facility] for weight loss which pt was agreeable to. At next visit, amlodipine 2.5mg  was started and pt did not wish to try [facility], instead preferring to focus on diet and exercise which her son successfully lost weight with. BP remained elevated at 162/108 at follow up visit in 09/13/2022. Pt did not wish to start new medication and she did report some LEE, so her chlorthalidone was increased to 37.5mg . However, this caused hypokalemia with K down to 3.3. Pt advised to start K supplementation but she did not want to add a new pill. Repeat K even lower at 3.1. She did end up starting supplementation and K normalized to 3.6. BP improved to 132/84 at last visit on 5/10 and Wegovy was discussed again.  Pt presents today in good spirits. Reports tolerating all medications well. Denies dizziness, headache, LEE, or vision changes. Stress level has decreased since her 2 sisters are helping to care for her mother now. Son has been helping with cooking and diet planning on and off. He lost weight by cutting back on sugar and carbs in his diet. Pt doesn't eat fast food. Has been cutting back on eating out at restaurants. Did pick up [facility] but never started. Has had 1 episode of palpitations a few weeks ago that resolved on its own in a few minutes. Rarely uses NSAIDs, takes Tylenol if needed. Is under a lot of stress caring for her mother.   Checks BP at  home on her left wrist. 155/98 this AM, usually runs in the low 150s/90-98. Has a bicep cuff at her mom's that she'll use occasionally, readings are similar.  Did pt bring in home cuff? Measuring higher than clinic reading Can inc amlo if needed (assuming no LEE) Did pt start wegovy?  Current HTN meds: labetalol 200mg  BID, amlodipine 2.5mg  daily, chlorthalidone 37.5mg  daily, propranolol 10mg  prn SVT  Previously tried: losartan-HCTZ - swelling in her feet  BP goal: <130/50mmHg  Family History: The patient's family history includes Hypertension in her brother, brother, father, mother, and sister.  Social History: The patient  reports that she has never smoked. She has never used smokeless tobacco. She reports that she does not drink alcohol and does not use drugs.   Diet: At work by , doesn't always eat breakfast. Sometimes will have fruit. Lunch - grabs on the go. Dinner varies, her dad cooks. Avoids canned food, doesn't add salt to food. 2 cups of coffee each day. Cut back to 2 sodas per week (used to drink more frequently).  Exercise: No formal exercise but stays busy with work and taking care of her mom.   Home BP readings: BP cuff delivered yesterday, hasn't started using yet  Wt Readings from Last 3 Encounters:  10/14/20 (!) 340 lb (154.2 kg)  07/28/20 (!) 338 lb (153.3 kg)  07/28/20 (!) 338 lb 9.6 oz (153.6 kg)   BP Readings from Last 3 Encounters:  10/14/20 132/84  08/25/20 (!) 162/108  07/28/20 (!) 162/92   Pulse Readings from Last 3 Encounters:  10/14/20 85  08/25/20 91  07/28/20 73    Renal function: CrCl cannot be calculated (Patient's most recent lab result is older than the maximum 21 days allowed.).  Past Medical History:  Diagnosis Date  . Arthritis    knees  . Hypertension   . SVT (supraventricular tachycardia) (HCC)    recurrent    Current Outpatient Medications on File Prior to Visit  Medication Sig Dispense Refill  . amLODipine (NORVASC) 2.5 MG  tablet Take 1 tablet (2.5 mg total) by mouth daily. 90 tablet 3  . chlorthalidone (HYGROTON) 25 MG tablet Take 1.5 tablets (37.5 mg total) by mouth daily. 45 tablet 2  . labetalol (NORMODYNE) 200 MG tablet Take 1 tablet (200 mg total) by mouth 2 (two) times daily. 180 tablet 1  . potassium chloride (KLOR-CON) 10 MEQ tablet Take 2 tablets (20 mEq total) by mouth daily. 180 tablet 3  . propranolol (INDERAL) 10 MG tablet Take one tablet (10 mg total) by mouth as needed for SVT. Take as directed by Dr. Graciela Husbands. 30 tablet 2   No current facility-administered medications on file prior to visit.    No Known Allergies   Assessment/Plan:  1. Hypertension - BP in clinic has improved notable and is very close to goal <130/59mmHg. Checking BMET today with recent K supplementation start. Will continue chlorthalidone 37.5mg  daily, amlodipine 2.5mg  daily, and labetalol 200mg  BID. Recheck BP in 4 weeks. Pt advised to bring home wrist cuff to visit since her home readings have been running much higher than clinic reading today.  2. Obesity - Encouraged pt to eat heart healthy diet and exercise as able. Previously discussed which pt was approved for and picked up from pharmacy but she hadn't started taking it. Revisited this again today, pt thinks she may start on injections. Will get an update at next visit in 1 month. Discussed that weight loss will also help to improve her BP.  Anastazja Isaac E. Shiesha Jahn, PharmD, BCACP, CPP  [facility] [address] [place], [place] Phone: [phone]; Fax: [phone] 11/11/2020 7:14 AM

## 2020-12-02 ENCOUNTER — Ambulatory Visit (INDEPENDENT_AMBULATORY_CARE_PROVIDER_SITE_OTHER): Payer: PRIVATE HEALTH INSURANCE | Admitting: Pharmacist

## 2020-12-02 ENCOUNTER — Other Ambulatory Visit: Payer: Self-pay

## 2020-12-02 DIAGNOSIS — I10 Essential (primary) hypertension: Secondary | ICD-10-CM

## 2020-12-02 MED ORDER — CHLORTHALIDONE 25 MG PO TABS: 37.5000 mg | ORAL_TABLET | Freq: Every day | ORAL | 3 refills | Status: DC

## 2020-12-02 NOTE — Progress Notes (Signed)
Patient ID: Nicole Frazier                 DOB: Feb 10, 1961                      MRN: [id number]     HPI: Nicole Frazier is a 60 y.o. female patient of Dr Graciela Husbands referred by Francis Dowse, PA to HTN clinic. PMH is significant for HTN, SVT, and morbid obesity (BMI 56). Home BP earlier this year were elevated ranging 170-190s/100s. She was restarted on labetalol and losartan-HCTZ and scheduled in HTN clinic for follow up. I saw pt in January when she reported swelling in her feet with losartan-HCTZ. I started pt on chlorthalidone 25mg  daily for BP and swelling. Also discussed trying [facility] for weight loss which pt was agreeable to. At next visit, amlodipine 2.5mg  was started and pt did not wish to try [facility], instead preferring to focus on diet and exercise which her son successfully lost weight with. BP remained elevated at 162/108 at follow up visit in March. Pt did not wish to start new medication and she did report some LEE, so her chlorthalidone was increased to 37.5mg . However, this caused hypokalemia with K down to 3.3. Pt advised to start K supplementation but she did not want to add a new pill. Repeat K even lower at 3.1. At last visit 10/14/20, BP had improved notably in office to 132/84, however home readings using her wrist cuff remained elevated. Current meds were continued and pt advised to bring home cuff to visit today.  Pt presents today for follow up. She has been getting over a cold for the past week. Has been taking lots of OTC medications, today is the first day she's been able to keep down food in a few days. Checked her BP this AM and it was 154/98, otherwise had not checked BP in the past week since she's been sick. Uses a wrist cuff and measures on her left wrist, forgot ot bring today. Recalls prior reading of 160s/98 before she was sick. She has been tolerating her medications well and denies dizziness, headache, vision changes and palpitations. Occasional LEE but not sure if this is in  relation to her diet. Has not started on Wegovy.  Overall stress level has decreased since her 2 sisters are helping to care for her mother now. Son has been helping with cooking and diet planning on and off. He lost weight by cutting back on sugar and carbs in his diet. Pt doesn't eat fast food. Has been cutting back on eating out at restaurants.  Rarely uses NSAIDs, takes Tylenol if needed. Is under a lot of stress caring for her mother.   Current HTN meds: labetalol 200mg  BID, amlodipine 2.5mg  daily, chlorthalidone 37.5mg  daily, propranolol 10mg  prn SVT  Previously tried: losartan-HCTZ - swelling in her feet  BP goal: <130/51mmHg  Family History: The patient's family history includes Hypertension in her brother, brother, father, mother, and sister.  Social History: The patient  reports that she has never smoked. She has never used smokeless tobacco. She reports that she does not drink alcohol and does not use drugs.   Diet: At work by , doesn't always eat breakfast. Sometimes will have fruit. Lunch - grabs on the go. Dinner varies, her dad cooks. Avoids canned food, doesn't add salt to food. 2 cups of coffee each day. Cut back to 2 sodas per week (used to drink more frequently).  Exercise: No formal exercise  but stays busy with work and taking care of her mom.   Home BP readings: BP cuff delivered yesterday, hasn't started using yet  Wt Readings from Last 3 Encounters:  10/14/20 (!) 340 lb (154.2 kg)  07/28/20 (!) 338 lb (153.3 kg)  07/28/20 (!) 338 lb 9.6 oz (153.6 kg)   BP Readings from Last 3 Encounters:  10/14/20 132/84  08/25/20 (!) 162/108  07/28/20 (!) 162/92   Pulse Readings from Last 3 Encounters:  10/14/20 85  08/25/20 91  07/28/20 73    Renal function: CrCl cannot be calculated (Patient's most recent lab result is older than the maximum 21 days allowed.).  Past Medical History:  Diagnosis Date   Arthritis    knees   Hypertension    SVT (supraventricular  tachycardia) (HCC)    recurrent    Current Outpatient Medications on File Prior to Visit  Medication Sig Dispense Refill   amLODipine (NORVASC) 2.5 MG tablet Take 1 tablet (2.5 mg total) by mouth daily. 90 tablet 3   chlorthalidone (HYGROTON) 25 MG tablet Take 1.5 tablets (37.5 mg total) by mouth daily. 45 tablet 2   labetalol (NORMODYNE) 200 MG tablet Take 1 tablet (200 mg total) by mouth 2 (two) times daily. 180 tablet 1   potassium chloride (KLOR-CON) 10 MEQ tablet Take 2 tablets (20 mEq total) by mouth daily. 180 tablet 3   propranolol (INDERAL) 10 MG tablet Take one tablet (10 mg total) by mouth as needed for SVT. Take as directed by Dr. Graciela Husbands. 30 tablet 2   No current facility-administered medications on file prior to visit.    No Known Allergies   Assessment/Plan:  1. Hypertension - BP has increased today above goal <130/30mmHg, potentially secondary to pt being sick and using a lot of OTC medications this week. Pressure at last visit in office was very close to goal. Pt wishes to continue on current medications including chlorthalidone 37.5mg  daily, amlodipine 2.5mg  daily, and labetalol 200mg  BID. Will call pt in 3-4 weeks to see how home readings are looking. If they're consistently elevated, will plan to increase labetalol to 400mg  BID and will schedule follow up in office at that time. Will also remind pt to bring home cuff to next visit. Also encouraged her to limit daily sodium to < 2,000mg  and increase physical activity as able.  2. Obesity - Encouraged pt to eat heart healthy diet and exercise as able. Previously discussed which pt was approved for and picked up from pharmacy but she hadn't started taking it. Revisited this again today, pt plans to start injections in a few weeks once she feels better. Discussed that weight loss will also help to improve her BP.  Abby Tucholski E. Vaniyah Lansky, PharmD, BCACP, CPP [facility] [address], [place], [address]  [place] Phone: [phone]; Fax: [phone] 12/02/2020 9:19 AM

## 2020-12-02 NOTE — Patient Instructions (Addendum)
It was nice to see you today!  Your blood pressure goal is < 130/39mmHg  Continue taking your current medications and monitor your blood pressure at home  Keep an eye on the occasional swelling in your ankles - when this happens, think about the food you've had int he 24 hours beforehand and how much sodium is in the food. Try to aim for < 2,000mg  of sodium each day  You can start the once weekly Wegovy injections in a few weeks once you're feeling better  I'll give you a call in 3-4 weeks to see how your readings are looking. We can increase the dose of your labetalol if needed for your blood pressure  You can take Mucinex or Robitussin (something that contains both guaifenesin and dextromethorphan) to help with your coughing

## 2021-03-09 ENCOUNTER — Other Ambulatory Visit: Payer: Self-pay

## 2021-03-09 MED ORDER — LABETALOL HCL 200 MG PO TABS: 200.0000 mg | ORAL_TABLET | Freq: Two times a day (BID) | ORAL | 1 refills | Status: DC

## 2021-03-24 NOTE — Progress Notes (Deleted)
Cardiology Office Note Date:  03/24/2021  Patient ID:  Nicole Frazier, DOB 08-23-1960, MRN [id number] PCP:  Patient, No Pcp Per (Inactive)  Cardiologist/Electrophysiologist: Dr. Graciela Husbands    Chief Complaint:   Evaluate BP  History of Present Illness: Nicole Frazier is a 60 y.o. female with history of HTN, SVT  She comes in to day t obe seen for Dr. Graciela Husbands.  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 She still did not want to have ablation.  Her BP was elevated and losartan/HCTZ  Also mentioned an ectopic atrial focus R LE swelling  I saw her 06/16/20 She feels well. Mentions about a week ago she had an episode of SVT lasted about , resolved with vagal maneuver.  This is the first in a couple years. She has not been taking her BP medicines for about a year.  Says the last time she went for a refill mentioned that she needed an appt for more and then just never came in and did not pursue it. She had been monitoring her BP generally 170's-90-100 range, and again, just never got around to coming in. Her PMD retired and she is without one currently She saw her GYN 05/24/20 and recalls her BP being high and urged to follow up here but can not recall what it was, but was not ashigh as today's.  BP was OOC She denies today or otherwise any kind of CP, SOB, no dizzy spells, near syncope or syncope. No headaches, visual changes, or neurological symptoms. Labetalol restarted 200mg  BID, losartan/HCTZ 50/12.5 QD started and planned for BP clinic early follow up.  She saw the BP clinic, not taking losartan/HCTZ with reports of associated edema, chlorthalidone started   I saw her 07/28/20 She is feeling well. Life has been very busy and distrupted with figuring out care for her parents  with her siblings, though seems to be settled mostly and feels like she can finally get back to taking care of herself. No CP, palpitations or cardiac awareness. No SOB, DOE No near syncope or syncope. She is very happy with the BP clinic and 07/30/20' care. She brought her BP cuff but the battery is low. BP was making some improvement No changes were made, planned to c/w [facility] team and see her back in 55mo  At her last [facility] visit June 2022, BP was elevated, she was getting over a illness, planned o monitor, discussed starting [facility] for weight loss efforts and planned to follow up on the phone for her BP as she started feeling better.  *** Wegovy injections for weight loss? *** BP *** labs, lipids... *** symptoms, SVT?    Past Medical History:  Diagnosis Date   Arthritis    knees   Hypertension    SVT (supraventricular tachycardia) (HCC)    recurrent    Past Surgical History:  Procedure Laterality Date   COLONOSCOPY N/A 01/15/2016   Procedure: COLONOSCOPY;  Surgeon: 03/16/2016, MD;  Location: [facility];  Service: Endoscopy;  Laterality: N/A;   KNEE SURGERY Bilateral yrs ago   arthroscopic    Current Outpatient Medications  Medication Sig Dispense Refill   amLODipine (NORVASC) 2.5 MG tablet Take 1 tablet (2.5 mg total) by mouth daily. 90 tablet 3   chlorthalidone (HYGROTON) 25 MG tablet Take 1.5 tablets (37.5  mg total) by mouth daily. 135 tablet 3   labetalol (NORMODYNE) 200 MG tablet Take 1 tablet (200 mg total) by mouth 2 (two) times daily. 180 tablet 1   potassium chloride (KLOR-CON) 10 MEQ tablet Take 2 tablets (20 mEq total) by mouth daily. 180 tablet 3   propranolol (INDERAL) 10 MG tablet Take one tablet (10 mg total) by mouth as needed for SVT. Take as directed by Dr. Graciela Husbands. 30 tablet 2   No current facility-administered medications for this visit.    Allergies:   Patient has no known allergies.   Social History:  The patient  reports that she has never smoked.  She has never used smokeless tobacco. She reports that she does not drink alcohol and does not use drugs.   Family History:  The patient's family history includes Hypertension in her brother, brother, father, mother, and sister.  ROS:  Please see the history of present illness.    All other systems are reviewed and otherwise negative.   PHYSICAL EXAM:  VS:  There were no vitals taken for this visit. BMI: There is no height or weight on file to calculate BMI.   Well nourished, well developed, in no acute distress HEENT: normocephalic, atraumatic Neck: no JVD, carotid bruits or masses Cardiac:  *** RRR; no significant murmurs, no rubs, or gallops Lungs:   *** CTA b/l, no wheezing, rhonchi or rales Abd: soft, nontender, obese MS: no deformity or atrophy Ext:  *** no edema Skin: warm and dry, no rash Neuro:  No gross deficits appreciated Psych: euthymic mood, full affect     EKG:  not done today  07/07/20: TTE IMPRESSIONS   1. Left ventricular ejection fraction, by estimation, is 60 to 65%. The  left ventricle has normal function. The left ventricle has no regional  wall motion abnormalities. Left ventricular diastolic parameters were  normal.   2. Right ventricular systolic function is normal. The right ventricular  size is normal.   3. Left atrial size was moderately dilated.   4. The mitral valve is abnormal. Trivial mitral valve regurgitation. No  evidence of mitral stenosis. Moderate mitral annular calcification.   5. The aortic valve is tricuspid. There is moderate calcification of the  aortic valve. There is moderate thickening of the aortic valve. Aortic  valve regurgitation is not visualized. Mild to moderate aortic valve  sclerosis/calcification is present,  without any evidence of aortic stenosis.   6. The inferior vena cava is normal in size with greater than 50%  respiratory variability, suggesting right atrial pressure of 3 mmHg.    Recent Labs: 07/28/2020: ALT  2 10/14/2020: BUN 12; Creatinine, Ser 0.85; Potassium 3.6; Sodium 141  No results found for requested labs within last 8760 hours.   CrCl cannot be calculated (Patient's most recent lab result is older than the maximum 21 days allowed.).   Wt Readings from Last 3 Encounters:  10/14/20 (!) 340 lb (154.2 kg)  07/28/20 (!) 338 lb (153.3 kg)  07/28/20 (!) 338 lb 9.6 oz (153.6 kg)     Other studies reviewed: Additional studies/records reviewed today include: summarized above  ASSESSMENT AND PLAN:  1. SVT     One brief episode in *** 2 years, resolved by vagal maneuver  2. HTN     ***  Disposition: ***   Current medicines are reviewed at length with the patient today.  The patient did not have any concerns regarding medicines.  Norma Fredrickson, PA-C 03/24/2021 3:28 PM  [address]  [place] [address] [phone] (office)  [phone] (fax)

## 2021-03-25 ENCOUNTER — Ambulatory Visit: Payer: No Typology Code available for payment source | Admitting: Physician Assistant

## 2021-04-04 NOTE — Progress Notes (Signed)
Cardiology Office Note Date:  04/06/2021  Patient ID:  Nicole Frazier, DOB 08-16-1960, MRN [id number] PCP:  Patient, No Pcp Per (Inactive)  Cardiologist/Electrophysiologist: Dr. Graciela Husbands    Chief Complaint:   planned follow up  History of Present Illness: Nicole Frazier is a 60 y.o. female with history of HTN, SVT  She comes in to day t obe seen for Dr. Graciela Husbands.  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 She still did not want to have ablation.  Her BP was elevated and losartan/HCTZ  Also mentioned an ectopic atrial focus R LE swelling  I saw her 06/16/20 She feels well. Mentions about a week ago she had an episode of SVT lasted about , resolved with vagal maneuver.  This is the first in a couple years. She has not been taking her BP medicines for about a year.  Says the last time she went for a refill mentioned that she needed an appt for more and then just never came in and did not pursue it. She had been monitoring her BP generally 170's-90-100 range, and again, just never got around to coming in. Her PMD retired and she is without one currently She saw her GYN 05/24/20 and recalls her BP being high and urged to follow up here but can not recall what it was, but was not ashigh as today's.  BP was OOC She denies today or otherwise any kind of CP, SOB, no dizzy spells, near syncope or syncope. No headaches, visual changes, or neurological symptoms. Labetalol restarted 200mg  BID, losartan/HCTZ 50/12.5 QD started and planned for BP clinic early follow up.  She saw the BP clinic, not taking losartan/HCTZ with reports of associated edema, chlorthalidone started   I saw her 07/28/20 She is feeling well. Life has been very busy and distrupted with figuring out care for her  parents with her siblings, though seems to be settled mostly and feels like she can finally get back to taking care of herself. No CP, palpitations or cardiac awareness. No SOB, DOE No near syncope or syncope. She is very happy with the BP clinic and 07/30/20' care. She brought her BP cuff but the battery is low. BP was making some improvement No changes were made, planned to c/w [facility] team and see her back in 40mo  At her last [facility] visit June 2022, BP was elevated, she was getting over a illness, planned o monitor, discussed starting [facility] for weight loss efforts and planned to follow up on the phone for her BP as she started feeling better.  TODAY Her BP cuff stopped working a week or so ago, had been about 150/90 when she checked it, pending a new machine She is doing well, still caring for her parents, Mom especially and is stressful. She had her work physical recently with full labs. No CP, SOB. Around July shw was woken by an SVT but by the time she got up and sat up, was gone.  This is the 1st in a very long time No near syncope or syncope  She remains a bit reluctant on the weight loss medication    Past Medical History:  Diagnosis Date   Arthritis    knees   Hypertension    SVT (supraventricular tachycardia) (HCC)    recurrent    Past  Surgical History:  Procedure Laterality Date   COLONOSCOPY N/A 01/15/2016   Procedure: COLONOSCOPY;  Surgeon: Graylin Shiver, MD;  Location: [facility];  Service: Endoscopy;  Laterality: N/A;   KNEE SURGERY Bilateral yrs ago   arthroscopic    Current Outpatient Medications  Medication Sig Dispense Refill   amLODipine (NORVASC) 2.5 MG tablet Take 1 tablet (2.5 mg total) by mouth daily. 90 tablet 3   chlorthalidone (HYGROTON) 25 MG tablet Take 1.5 tablets (37.5 mg total) by mouth daily. 135 tablet 3   labetalol (NORMODYNE) 200 MG tablet Take 1 tablet (200 mg total) by mouth 2 (two) times daily. 180 tablet 1   potassium chloride (KLOR-CON)  10 MEQ tablet Take 2 tablets (20 mEq total) by mouth daily. 180 tablet 3   propranolol (INDERAL) 10 MG tablet Take one tablet (10 mg total) by mouth as needed for SVT. Take as directed by Dr. Graciela Husbands. 30 tablet 2   No current facility-administered medications for this visit.    Allergies:   Patient has no known allergies.   Social History:  The patient  reports that she has never smoked. She has never used smokeless tobacco. She reports that she does not drink alcohol and does not use drugs.   Family History:  The patient's family history includes Hypertension in her brother, brother, father, mother, and sister.  ROS:  Please see the history of present illness.    All other systems are reviewed and otherwise negative.   PHYSICAL EXAM:  VS:  BP (!) 152/82   Pulse 82   Ht 5\' 6"  (1.676 m)   Wt (!) 343 lb 12.8 oz (155.9 kg)   SpO2 98%   BMI 55.49 kg/m  BMI: Body mass index is 55.49 kg/m.   Well nourished, well developed, in no acute distress HEENT: normocephalic, atraumatic Neck: no JVD, carotid bruits or masses Cardiac:  RRR; no significant murmurs, no rubs, or gallops Lungs:   CTA b/l, no wheezing, rhonchi or rales Abd: soft, nontender, obese MS: no deformity or atrophy Ext:  no edema Skin: warm and dry, no rash Neuro:  No gross deficits appreciated Psych: euthymic mood, full affect   EKG:  not done today  07/07/20: TTE IMPRESSIONS   1. Left ventricular ejection fraction, by estimation, is 60 to 65%. The  left ventricle has normal function. The left ventricle has no regional  wall motion abnormalities. Left ventricular diastolic parameters were  normal.   2. Right ventricular systolic function is normal. The right ventricular  size is normal.   3. Left atrial size was moderately dilated.   4. The mitral valve is abnormal. Trivial mitral valve regurgitation. No  evidence of mitral stenosis. Moderate mitral annular calcification.   5. The aortic valve is tricuspid. There  is moderate calcification of the  aortic valve. There is moderate thickening of the aortic valve. Aortic  valve regurgitation is not visualized. Mild to moderate aortic valve  sclerosis/calcification is present,  without any evidence of aortic stenosis.   6. The inferior vena cava is normal in size with greater than 50%  respiratory variability, suggesting right atrial pressure of 3 mmHg.    Recent Labs: 07/28/2020: ALT 2 10/14/2020: BUN 12; Creatinine, Ser 0.85; Potassium 3.6; Sodium 141  No results found for requested labs within last 8760 hours.   CrCl cannot be calculated (Patient's most recent lab result is older than the maximum 21 days allowed.).   Wt Readings from Last 3 Encounters:  04/06/21 04/08/21)  343 lb 12.8 oz (155.9 kg)  10/14/20 (!) 340 lb (154.2 kg)  07/28/20 (!) 338 lb (153.3 kg)     Other studies reviewed: Additional studies/records reviewed today include: summarized above  ASSESSMENT AND PLAN:  1. SVT     One brief episode in the last few years, self terminated  2. HTN     Not completely controlled, increase amlodipine to 5mg  daily     F/u with [facility]/BP clinic in 67mo  Disposition: EP in 29mo, sooner if needed   Current medicines are reviewed at length with the patient today.  The patient did not have any concerns regarding medicines.  5mo, PA-C 04/06/2021 3:05 PM     [facility] [address] [place] [phone] (office)  [phone] (fax)

## 2021-04-06 ENCOUNTER — Encounter: Payer: Self-pay | Admitting: Physician Assistant

## 2021-04-06 ENCOUNTER — Other Ambulatory Visit: Payer: Self-pay

## 2021-04-06 ENCOUNTER — Ambulatory Visit: Payer: No Typology Code available for payment source | Admitting: Physician Assistant

## 2021-04-06 VITALS — BP 152/82 | HR 82 | Ht 66.0 in | Wt 343.8 lb

## 2021-04-06 DIAGNOSIS — Z6841 Body Mass Index (BMI) 40.0 and over, adult: Secondary | ICD-10-CM | POA: Diagnosis not present

## 2021-04-06 DIAGNOSIS — I10 Essential (primary) hypertension: Secondary | ICD-10-CM

## 2021-04-06 DIAGNOSIS — I471 Supraventricular tachycardia: Secondary | ICD-10-CM | POA: Diagnosis not present

## 2021-04-06 MED ORDER — AMLODIPINE BESYLATE 5 MG PO TABS: 5.0000 mg | ORAL_TABLET | Freq: Every day | ORAL | 1 refills | Status: DC

## 2021-04-06 NOTE — Patient Instructions (Addendum)
Medication Instructions:    START TAKING AMLODIPINE  5 MG ONCE A DAY   *If you need a refill on your cardiac medications before your next appointment, please call your pharmacy*   Lab Work:  NONE ORDERED  TODAY    If you have labs (blood work) drawn today and your tests are completely normal, you will receive your results only by: MyChart Message (if you have MyChart) OR A paper copy in the mail If you have any lab test that is abnormal or we need to change your treatment, we will call you to review the results.   Testing/Procedures: NONE ORDERED  TODAY     Follow-Up: At [facility], you and your health needs are our priority.  As part of our continuing mission to provide you with exceptional heart care, we have created designated Provider Care Teams.  These Care Teams include your primary Cardiologist (physician) and Advanced Practice Providers (APPs -  Physician Assistants and Nurse Practitioners) who all work together to provide you with the care you need, when you need it.  We recommend signing up for the patient portal called "MyChart".  Sign up information is provided on this After Visit Summary.  MyChart is used to connect with patients for Virtual Visits (Telemedicine).  Patients are able to view lab/test results, encounter notes, upcoming appointments, etc.  Non-urgent messages can be sent to your provider as well.   To learn more about what you can do with MyChart, go to [email or web address].    Your next appointment:  3 MONTHS WITH PHARMACIST/ HYPERTENSION CLINIC ( [facility]) 6 month(s)  The format for your next appointment:   In Person  Provider:   You may see Dr. Graciela Husbands or one of the following Advanced Practice Providers on your designated Care Team:   Francis Dowse, [place]    Other Instructions

## 2021-07-08 ENCOUNTER — Ambulatory Visit: Payer: No Typology Code available for payment source

## 2021-07-13 NOTE — Progress Notes (Signed)
Patient ID: Nicole Frazier                 DOB: September 22, 1960                      MRN: [id number]     HPI: Nicole Frazier is a 61 y.o. female patient of Dr. Caryl Frazier referred by Nicole Standard, PA-C to HTN clinic. PMH is significant for HTN, SVT, obesity. Has been seen in the past by HTN clinic and also discussed starting [facility] but she had changed her mind and opted to pursue diet/exercise changes alone. At last visit with Nicole on 04/06/21, BP was elevated and amlodipine was increased.   Patient arrives in good spirits. Reports overall feeling well. Denies dizziness, headache, LEE, blurred vision, chest pain. Reports adherence to BP medications but does have trouble sometimes remembering to take the PM dose of labetalol. She has been getting better now that she works it into her routine earlier in the evening. She is tolerating all of her medications well.   She is now interested in starting Wegovy. She was previously approved for [facility] in 2022 but the prior authorization is now expired. She had picked up the first box of Wegovy last year but had changed her mind about starting as she wanted to focus on diet/exercise for longer and was nervous about adverse effects. It has been about a year since then and she has not lost any weight with diet/exercise alone. She has discussed [place] with some of her friends who are on it and tolerate it well so she would like to give it a try.   Current HTN meds: amlodipine 5 mg daily, chlorthalidone 37.5 mg daily, labetalol 200 mg BID, potassium chloride 20 mEq daily, propranolol 19m prn SVT, potassium chloride 20 mEq daily Previously tried: losartan-HCTZ (swelling in feet) BP goal: <130/80 mmHg  Current weight management medications: none Previously tried meds: none Current meds that may affect weight: none Baseline weight/BMI: 342.8 lb (BMI 55.33) Insurance payor: [employer] PA was approved last year but   Family History: The patient's family history includes  Hypertension in her brother, brother, father, mother, and sister.  Social History: The patient reports that she has never smoked. She has never used smokeless tobacco. She reports that she does not drink alcohol and does not use drugs.   Diet: does not cook with or add salt to foods (has none in her house), only eats foods with salt when she is at a restaurant. 2 cups coffee/day - trying to cut down to 1. Down to 2 cans of soda/week.   Exercise: none currently, she wants to start slowly increasing her activity  Home BP readings: not currently checking, gave BP cuff to mother, can't get a new one through work until March  Wt Readings from Last 3 Encounters:  07/14/21 (!) 342 lb 12.8 oz (155.5 kg)  04/06/21 (!) 343 lb 12.8 oz (155.9 kg)  10/14/20 (!) 340 lb (154.2 kg)   BP Readings from Last 3 Encounters:  07/14/21 (!) 152/92  04/06/21 (!) 152/82  12/02/20 (!) 158/98   Pulse Readings from Last 3 Encounters:  07/14/21 70  04/06/21 82  12/02/20 84    Renal function: CrCl cannot be calculated (Patient's most recent lab result is older than the maximum 21 days allowed.).  Past Medical History:  Diagnosis Date   Arthritis    knees   Hypertension    SVT (supraventricular tachycardia) (HCC)    recurrent  Current Outpatient Medications on File Prior to Visit  Medication Sig Dispense Refill   chlorthalidone (HYGROTON) 25 MG tablet Take 1.5 tablets (37.5 mg total) by mouth daily. 135 tablet 3   labetalol (NORMODYNE) 200 MG tablet Take 1 tablet (200 mg total) by mouth 2 (two) times daily. 180 tablet 1   potassium chloride (KLOR-CON) 10 MEQ tablet Take 2 tablets (20 mEq total) by mouth daily. 180 tablet 3   Semaglutide-Weight Management (WEGOVY) 0.25 MG/0.5ML SOAJ Inject 0.25 mg into the skin.     propranolol (INDERAL) 10 MG tablet Take one tablet (10 mg total) by mouth as needed for SVT. Take as directed by Dr. Caryl Frazier. 30 tablet 2   No current facility-administered medications on  file prior to visit.    No Known Allergies   Assessment/Plan:  1. Hypertension - BP not at goal <130/80 mmHg despite adherence with current medications. She is agreeable to increase amlodipine to 10 mg daily. Continue chlorthalidone 37.5 mg daily, labetalol 200 mg BID, potassium chloride 20 mEq daily, propranolol 3m prn SVT, potassium chloride 20 mEq daily. Check BMET today to assess potassium since she is on supplementation due to history of hypokalemia after her chlorthalidone dose was increased last year.   2. Weight loss - Patient has not met goal of at least 5% of body weight loss with comprehensive lifestyle modifications alone in the past 3-6 months. Pharmacotherapy is appropriate to pursue as augmentation. Will start Wegovy. Will submit new PA. Confirmed patient has no personal or family history of medullary thyroid carcinoma (MTC) or Multiple Endocrine Neoplasia syndrome type 2 (MEN 2).   Advised patient on common side effects including nausea, diarrhea, dyspepsia, decreased appetite, and fatigue. Counseled patient on reducing meal size and how to titrate medication to minimize side effects. Counseled patient to call if intolerable side effects or if experiencing dehydration, abdominal pain, or dizziness. Patient will adhere to dietary modifications and will target at least 150 minutes of moderate intensity exercise weekly.   Injection technique reviewed at today's visit and patient successfully self-administered first dose of Wegovy into the fatty tissue of the abdomen. Confirmed box of Wegovy was still in date and has been refrigerated since she filled it last year.   GLP1 Agonist Titration Plan:  Will plan to follow the titration plan as below, pending patient is tolerating each dose before increasing to the next. Can slow titration if needed for tolerability.  -Month 1: Inject Wegovy 0.25 mg once weekly x 4 weeks -Month 2: Inject Wegovy 0.5 mg once weekly x 4 weeks -Month 3: Inject  Wegovy 1 mg once weekly x 4 weeks -Month 4: Inject Wegovy 1.740mSQ once weekly x 4 weeks -Month 5+: Inject Wegovy 2.55m27mQ once weekly   Checking baseline A1c and lipid panel today then [facility]he[facility] 4 weeks for BP recheck and to increase Wegovy to 0.5 mg. Follow up visit also scheduled for 3 month Wegovy follow up.   MadRebbeca PaulharmD PGY2 Ambulatory Care Pharmacy Resident 07/14/2021 2:40 PM

## 2021-07-14 ENCOUNTER — Ambulatory Visit: Payer: No Typology Code available for payment source | Admitting: Student-PharmD

## 2021-07-14 ENCOUNTER — Other Ambulatory Visit: Payer: Self-pay

## 2021-07-14 VITALS — BP 152/92 | HR 70 | Wt 342.8 lb

## 2021-07-14 DIAGNOSIS — Z6841 Body Mass Index (BMI) 40.0 and over, adult: Secondary | ICD-10-CM | POA: Diagnosis not present

## 2021-07-14 DIAGNOSIS — I10 Essential (primary) hypertension: Secondary | ICD-10-CM

## 2021-07-14 MED ORDER — AMLODIPINE BESYLATE 10 MG PO TABS: 10.0000 mg | ORAL_TABLET | Freq: Every day | ORAL | 3 refills | Status: DC

## 2021-07-14 NOTE — Patient Instructions (Signed)
It was nice to see you today!  Your goal blood pressure is less than 130/80 mmHg. In clinic, your blood pressure was 152/92 mmHg.  Medication Changes: Increase amlodipine to 10 mg daily. Continue all other medications.   Monitor blood pressure at home daily and keep a log (on your phone or piece of paper) to bring with you to your next visit. Write down date, time, blood pressure and pulse.  Keep up the good work with diet and exercise. Aim for a diet full of vegetables, fruit and lean meats (chicken, [place], fish). Try to limit salt intake by eating fresh or frozen vegetables (instead of canned), rinse canned vegetables prior to cooking and do not add any additional salt to meals.   BMWUXL Counseling Points This medication reduces your appetite and may make you feel fuller longer.  Stop eating when your body tells you that you are full. This will likely happen sooner than you are used to. Store your medication in the fridge until you are ready to use it. Inject your medication in the fatty tissue of your lower abdominal area (2 inches away from belly button) or upper outer thigh. Rotate injection sites. Each pen will last you about 1 month (the first month it will last a few weeks longer). Use a different needle with each weekly injection. Common side effects include: nausea, diarrhea/constipation, and heartburn, and are more likely to occur if you overeat.  KGMWNU Titration Plan:  Will plan to follow the titration plan as below, pending you are tolerating each dose before increasing to the next. Can slow titration if needed for tolerability.  -Month 1: Inject Wegovy 0.25 mg once weekly x 4 weeks -Month 2: Inject Wegovy 0.5 mg once weekly x 4 weeks -Month 3: Inject Wegovy 1 mg once weekly x 4 weeks -Month 4: Inject UVOZDG 1.7mg  SQ once weekly x 4 weeks -Month 5+: Inject Wegovy 2.4mg  SQ once weekly    Tips for living a healthier life     Building a Healthy and Balanced Diet Make  most of your meal vegetables and fruits -  of your plate. Aim for color and variety, and remember that potatoes dont count as vegetables on the Healthy Eating Plate because of their negative impact on blood sugar.  Go for whole grains -  of your plate. Whole and intact grains--whole wheat, barley, wheat berries, quinoa, oats, brown rice, and foods made with them, such as whole wheat pasta--have a milder effect on blood sugar and insulin than white bread, white rice, and other refined grains.  Protein power -  of your plate. Fish, poultry, beans, and nuts are all healthy, versatile protein sources--they can be mixed into salads, and pair well with vegetables on a plate. Limit red meat, and avoid processed meats such as bacon and sausage.  Healthy plant oils - in moderation. Choose healthy vegetable oils like olive, canola, soy, corn, sunflower, peanut, and others, and avoid partially hydrogenated oils, which contain unhealthy trans fats. Remember that low-fat does not mean healthy.  Drink water, coffee, or tea. Skip sugary drinks, limit milk and dairy products to one to two servings per day, and limit juice to a small glass per day.  Stay active. The red figure running across the Healthy Eating Plates placemat is a reminder that staying active is also important in weight control.  The main message of the Healthy Eating Plate is to focus on diet quality:  The type of carbohydrate in the diet is more  important than the amount of carbohydrate in the diet, because some sources of carbohydrate--like vegetables (other than potatoes), fruits, whole grains, and beans--are healthier than others. The Healthy Eating Plate also advises consumers to avoid sugary beverages, a major source of calories--usually with little nutritional value--in the American diet. The Healthy Eating Plate encourages consumers to use healthy oils, and it does not set a maximum on the percentage of calories people should  get each day from healthy sources of fat. In this way, the Healthy Eating Plate recommends the opposite of the low-fat message promoted for decades by the USDA.  CueTune.com.ee  SUGAR  Sugar is a huge problem in the modern day diet. Sugar is a big contributor to heart disease, diabetes, high triglyceride levels, fatty liver disease and obesity. Sugar is hidden in almost all packaged foods/beverages. Added sugar is extra sugar that is added beyond what is naturally found and has no nutritional benefit for your body. The American Heart Association recommends limiting added sugars to no more than 25g for women and 36 grams for men per day. There are many names for sugar including maltose, sucrose (names ending in "ose"), high fructose corn syrup, molasses, cane sugar, corn sweetener, raw sugar, syrup, honey or fruit juice concentrate.   One of the best ways to limit your added sugars is to stop drinking sweetened beverages such as soda, sweet tea, and fruit juice.  There is 65g of added sugars in one 20oz bottle of Coke! That is equal to 7.5 donuts.   Pay attention and read all nutrition facts labels. Below is an examples of a nutrition facts label. The #1 is showing you the total sugars where the # 2 is showing you the added sugars. This one serving has almost the max amount of added sugars per day!     20 oz Soda 65g Sugar = 7.5 Glazed Donuts  16oz Energy  Drink 54g Sugar = 6.5 Glazed Donuts  Large Sweet  Tea 38g Sugar = 4 Glazed Donuts  20oz Sports  Drink 34g Sugar = 3.5 Glazed Donuts  8oz Chocolate Milk 24g Sugar =2.5 Glazed Donuts  8oz Orange  Juice 21g Sugar = 2 Glazed Donuts  1 Juice Box 14g Sugar = 1.5 Glazed Donuts  16oz Water= NO SUGAR!!  EXERCISE  Exercise is good. Weve all heard that. In an ideal world, we would all have time and resources to get plenty of it. When you are active, your heart pumps more  efficiently and you will feel better.  Multiple studies show that even walking regularly has benefits that include living a longer life. The American Heart Association recommends 150 minutes per week of exercise (30 minutes per day most days of the week). You can do this in any increment you wish. Nine or more 10-minute walks count. So does an hour-long exercise class. Break the time apart into what will work in your life. Some of the best things you can do include walking briskly, jogging, cycling or swimming laps. Not everyone is ready to exercise. Sometimes we need to start with just getting active. Here are some easy ways to be more active throughout the day:  Take the stairs instead of the elevator  Go for a 10-15 minute walk during your lunch break (find a friend to make it more enjoyable)  When shopping, park at the back of the parking lot  If you take public transportation, get off one stop early and walk the extra distance  Pace around  while making phone calls  Check with your doctor if you arent sure what your limitations may be. Always remember to drink plenty of water when doing any type of exercise. Dont feel like a failure if youre not getting the 90-150 minutes per week. If you started by being a couch potato, then just a 10-minute walk each day is a huge improvement. Start with little victories and work your way up.   HEALTHY EATING TIPS  When looking to improve your eating habits, whether to lose weight, lower blood pressure or just be healthier, it helps to know what a serving size is.   Grains 1 slice of bread,  bagel,  cup pasta or rice  Vegetables 1 cup fresh or raw vegetables,  cup cooked or canned Fruits 1 piece of medium sized fruit,  cup canned,   Meats/Proteins  cup dried       1 oz meat, 1 egg,  cup cooked beans, nuts or seeds  Dairy        Fats Individual yogurt container, 1 cup (8oz)    1 teaspoon margarine/butter or vegetable  milk or milk alternative,  1 slice of cheese          oil; 1 tablespoon mayonnaise or salad dressing                  Plan ahead: make a menu of the meals for a week then create a grocery list to go with that menu. Consider meals that easily stretch into a night of leftovers, such as stews or casseroles. Or consider making two of your favorite meal and put one in the freezer for another night. Try a night or two each week that is meatless or no cook such as salads. When you get home from the grocery store wash and prepare your vegetables and fruits. Then when you need them they are ready to go.   Tips for going to the grocery store:  Buy store or generic brands  Check the weekly ad from your store on-line or in their in-store flyer  Look at the unit price on the shelf tag to compare/contrast the costs of different items  Buy fruits/vegetables in season  Carrots, bananas and apples are low-cost, naturally healthy items  If meats or frozen vegetables are on sale, buy some extras and put in your freezer  Limit buying prepared or ready to eat items, even if they are pre-made salads or fruit snacks  Do not shop when youre hungry  Foods at eye level tend to be more expensive. Look on the high and low shelves for deals.  Consider shopping at the farmers market for fresh foods in season.  Avoid the cookie and chip aisles (these are expensive, high in calories and low in nutritional value). Shop on the outside of the grocery store.  Healthy food preparations:  If you cant get lean hamburger, be sure to drain the fat when cooking  Steam, saut (in olive oil), grill or bake foods  Experiment with different seasonings to avoid adding salt to your foods. Kosher salt, sea salt and Himalayan salt are all still salt and should be avoided. Try seasoning food with onion, garlic, thyme, rosemary, basil ect. Onion powder or garlic powder is ok. Avoid if it says salt (ie garlic salt).

## 2021-07-15 LAB — BASIC METABOLIC PANEL
BUN/Creatinine Ratio: 15 (ref 12–28)
BUN: 14 mg/dL (ref 8–27)
CO2: 29 mmol/L (ref 20–29)
Calcium: 9.8 mg/dL (ref 8.7–10.3)
Chloride: 97 mmol/L (ref 96–106)
Creatinine, Ser: 0.96 mg/dL (ref 0.57–1.00)
Glucose: 102 mg/dL — ABNORMAL HIGH (ref 70–99)
Potassium: 3.6 mmol/L (ref 3.5–5.2)
Sodium: 140 mmol/L (ref 134–144)
eGFR: 67 mL/min/{1.73_m2} (ref >59)

## 2021-07-15 LAB — HEMOGLOBIN A1C
Est. average glucose Bld gHb Est-mCnc: 126 mg/dL
Hgb A1c MFr Bld: 6 % — ABNORMAL HIGH (ref 4.8–5.6)

## 2021-07-15 LAB — LIPID PANEL
Chol/HDL Ratio: 4.5 ratio — ABNORMAL HIGH (ref 0.0–4.4)
Cholesterol, Total: 180 mg/dL (ref 100–199)
HDL: 40 mg/dL (ref >39)
LDL Chol Calc (NIH): 112 mg/dL — ABNORMAL HIGH (ref 0–99)
Triglycerides: 157 mg/dL — ABNORMAL HIGH (ref 0–149)
VLDL Cholesterol Cal: 28 mg/dL (ref 5–40)

## 2021-07-16 ENCOUNTER — Telehealth: Payer: Self-pay | Admitting: Student-PharmD

## 2021-07-16 MED ORDER — SEMAGLUTIDE-WEIGHT MANAGEMENT 1 MG/0.5ML ~~LOC~~ SOAJ: 1.0000 mg | SUBCUTANEOUS | 0 refills | Status: DC

## 2021-07-16 MED ORDER — SEMAGLUTIDE-WEIGHT MANAGEMENT 0.5 MG/0.5ML ~~LOC~~ SOAJ: 0.5000 mg | SUBCUTANEOUS | 0 refills | Status: AC

## 2021-07-16 MED ORDER — SEMAGLUTIDE-WEIGHT MANAGEMENT 1.7 MG/0.75ML ~~LOC~~ SOAJ
1.7000 mg | SUBCUTANEOUS | 0 refills | Status: DC
Start: 2021-10-06 — End: 2021-10-27

## 2021-07-16 MED ORDER — SEMAGLUTIDE-WEIGHT MANAGEMENT 2.4 MG/0.75ML ~~LOC~~ SOAJ: 2.4000 mg | SUBCUTANEOUS | 11 refills | Status: DC

## 2021-07-16 NOTE — Telephone Encounter (Signed)
Called patient to review lab results from 07/14/21 and let her know that [facility] PA is approved through 07/15/22. Sent in the rest of the dose titration starting with 0.5 mg since she started 0.25 mg this week. She has been doing well since her first injection 2 days ago. No AE so far. She has no questions at this time. Follow up is planned for 08/11/21 for BP f/u.

## 2021-08-10 NOTE — Progress Notes (Signed)
Patient ID: Nicole Frazier                 DOB: Oct 28, 1960                      MRN: [id number] ? ? ? ? ?HPI: ?Nicole Frazier is a 61 y.o. female patient of Dr. Graciela Husbands referred by Francis Dowse, PA-C to HTN clinic. PMH is significant for HTN, SVT, obesity. Has been seen in the past by HTN clinic and also discussed starting [facility] but she had changed her mind and opted to pursue diet/exercise changes alone. At last visit with Renee on 04/06/21, BP was elevated and amlodipine was increased. At last pharmacy visit on 07/14/21, BP was 152/92 and amlodipine was increased. She was also interested in starting [facility]. Resubmitted PA which was approved through 07/15/22.  ? ?Today, patient arrives in good spirits. Reports some nausea with Wegovy and stomach pain at night but this has overall been mild. Did have constipation for a few days after the first couple of doses which was bothersome but has improved. She is due to increase to 0.5 mg today which she would like to still do as planned. Reports tolerating amlodipine well but does not some swelling in her right foot. This is not bothersome to her at this time and she wants to continue on amlodipine to see if this improves. Discussed that we can decrease back to 5 mg if needed since she did not have any swelling at that dose. Denies dizziness, lightheadedness, headache, blurred vision, chest pain. She had 1 SVT episode last night that resolved with propranolol. Reports adherence to BP medications and she has taken them today.  ? ?Current HTN meds: amlodipine 10 mg daily, chlorthalidone 37.5 mg daily, labetalol 200 mg BID, potassium chloride 20 mEq daily, propranolol 10mg  prn SVT ?Previously tried: losartan-HCTZ (swelling in feet) ?BP goal: <130/80 mmHg ? ?Current weight management medications: Wegovy  ?Previously tried meds: none ?Current meds that may affect weight: none ?Baseline weight/BMI: 342.8 lb (BMI 55.33) ?Insurance payor: [employer]. PA approved through 07/15/22. Copay is $25 -  educated about $0 copay card.  ? ?Family History: The patient's family history includes Hypertension in her brother, brother, father, mother, and sister. ? ?Social History: The patient reports that she has never smoked. She has never used smokeless tobacco. She reports that she does not drink alcohol and does not use drugs.  ? ?Diet: Does not cook with or add salt to foods (has none in her house), only eats foods with salt when she is at a restaurant. 2 cups coffee/day - trying to cut down to 1. Down to 2 cans of soda/week.  ? ?Exercise: None currently, she wants to start slowly increasing her activity ? ?Home BP readings: Not currently checking, gave BP cuff to mother, can't get a new one through work until March - needs to call to get it this month ? ?Labs:  ?-07/14/21: Scr 0.96, Na 140, K 3.6 (chlorthalidone 37.5 mg daily, potassium chloride 20 mEq daily) ? ?Wt Readings from Last 3 Encounters:  ?08/11/21 (!) 340 lb 9.6 oz (154.5 kg)  ?07/14/21 (!) 342 lb 12.8 oz (155.5 kg)  ?04/06/21 (!) 343 lb 12.8 oz (155.9 kg)  ? ?BP Readings from Last 3 Encounters:  ?08/11/21 128/82  ?07/14/21 (!) 152/92  ?04/06/21 (!) 152/82  ? ?Pulse Readings from Last 3 Encounters:  ?08/11/21 84  ?07/14/21 70  ?04/06/21 82  ? ? ?Renal function: ?CrCl cannot be calculated (  Patient's most recent lab result is older than the maximum 21 days allowed.). ? ?Past Medical History:  ?Diagnosis Date  ? Arthritis   ? knees  ? Hypertension   ? SVT (supraventricular tachycardia) (HCC)   ? recurrent  ? ? ?Current Outpatient Medications on File Prior to Visit  ?Medication Sig Dispense Refill  ? amLODipine (NORVASC) 10 MG tablet Take 1 tablet (10 mg total) by mouth daily. 90 tablet 3  ? chlorthalidone (HYGROTON) 25 MG tablet Take 1.5 tablets (37.5 mg total) by mouth daily. 135 tablet 3  ? labetalol (NORMODYNE) 200 MG tablet Take 1 tablet (200 mg total) by mouth 2 (two) times daily. 180 tablet 1  ? potassium chloride (KLOR-CON) 10 MEQ tablet Take 2 tablets  (20 mEq total) by mouth daily. 180 tablet 3  ? propranolol (INDERAL) 10 MG tablet Take one tablet (10 mg total) by mouth as needed for SVT. Take as directed by Dr. Graciela Husbands. 30 tablet 2  ? Semaglutide-Weight Management (WEGOVY) 0.25 MG/0.5ML SOAJ Inject 0.25 mg into the skin.    ? Semaglutide-Weight Management 0.5 MG/0.5ML SOAJ Inject 0.5 mg into the skin once a week for 28 days. 2 mL 0  ? [START ON 09/08/2021] Semaglutide-Weight Management 1 MG/0.5ML SOAJ Inject 1 mg into the skin once a week for 28 days. 2 mL 0  ? [START ON 10/06/2021] Semaglutide-Weight Management 1.7 MG/0.75ML SOAJ Inject 1.7 mg into the skin once a week for 28 days. 3 mL 0  ? [START ON 11/09/2021] Semaglutide-Weight Management 2.4 MG/0.75ML SOAJ Inject 2.4 mg into the skin once a week. 3 mL 11  ? ?No current facility-administered medications on file prior to visit.  ? ? ?No Known Allergies ? ? ?Assessment/Plan: ? ?1. Hypertension - BP is now at goal <130/80 mmHg after increasing amlodipine at last visit. She is having some swelling in her right foot over the past couple of weeks but this is unilateral - would expect bilateral swelling if due to the medication. The swelling is not bothersome to her and she would like to continue on this for now to see if it improves. Will continue current medications: amlodipine 10 mg daily, chlorthalidone 37.5 mg daily, labetalol 200 mg BID, potassium chloride 20 mEq daily, propranolol 10mg  prn SVT. Encouraged her to call her insurance to get a new BP cuff and to start checking at home. Instructed to bring cuff to next visit in May.  ? ?2. Weight loss - She has completed 4 weeks of Wegovy 0.25 mg weekly and will increase today to 0.5 mg weekly. Has had some adverse effects including nausea, stomach pain, constipation, but overall has been mild and improved with time. We discussed slowing down the titration (taking 0.25 mg for an extra month) but she would like to continue the titration plan as below for now and she will  reach out if she experiences bothersome adverse effects that require her to slow down the titration. Continue working on diet and exercise modifications. I let her know about copay card to bring cost to $0 (she pays $25/month currently).  ? ?GLP1 Agonist Titration Plan:  ?Will plan to follow the titration plan as below, pending patient is tolerating each dose before increasing to the next. Can slow titration if needed for tolerability.  ?-Month 1: Inject Wegovy 0.25 mg once weekly x 4 weeks ?-Month 2: Inject Wegovy 0.5 mg once weekly x 4 weeks ?-Month 3: Inject Wegovy 1 mg once weekly x 4 weeks ?-Month 4: Inject June  1.7mg  SQ once weekly x 4 weeks ?-Month 5+: Inject Wegovy 2.4mg  SQ once weekly  ? ?Follow up in 1 month by phone at time of next [facility] dose increase. Next visit in May for [facility] 3 month visit and BP check.  ? ?Pervis Hocking, PharmD ?PGY2 Ambulatory Care Pharmacy Resident ?08/11/2021 3:47 PM ? ?

## 2021-08-11 ENCOUNTER — Ambulatory Visit: Payer: No Typology Code available for payment source | Admitting: Student-PharmD

## 2021-08-11 ENCOUNTER — Other Ambulatory Visit: Payer: Self-pay

## 2021-08-11 VITALS — BP 128/82 | HR 84 | Wt 340.6 lb

## 2021-08-11 DIAGNOSIS — Z6841 Body Mass Index (BMI) 40.0 and over, adult: Secondary | ICD-10-CM

## 2021-08-11 DIAGNOSIS — I10 Essential (primary) hypertension: Secondary | ICD-10-CM | POA: Diagnosis not present

## 2021-08-11 NOTE — Patient Instructions (Addendum)
It was nice to see you today! ? ?Your goal blood pressure is <130/80 mmHg. In clinic, your blood pressure was 128/82 mmHg. ? ?Medication Changes: ? ?Continue your current medications.  ? ?Bring your new BP cuff to your next visit.  ? ?Monitor blood pressure at home daily and keep a log (on your phone or piece of paper) to bring with you to your next visit. Write down date, time, blood pressure and pulse. ? ?Keep up the good work with diet and exercise. Aim for a diet full of vegetables, fruit and lean meats (chicken, [place], fish). Try to limit salt intake by eating fresh or frozen vegetables (instead of canned), rinse canned vegetables prior to cooking and do not add any additional salt to meals.  ? ?Follow up visit May 2 for Wegovy/blood pressure follow up visit.  ? ?Let me know if you have any problems with increasing your Wegovy today to 0.5 mg. I will call you in 4 weeks to see how you are doing on that dose as we plan to increase to 1 mg at that time.  ? ?You can sign up for a copay savings card at:  ?icrowncustoms.com.html ?You can also google "Wegovy savings card" to find that website.  ? ?

## 2021-09-03 ENCOUNTER — Other Ambulatory Visit: Payer: Self-pay

## 2021-09-08 ENCOUNTER — Telehealth: Payer: Self-pay | Admitting: Student-PharmD

## 2021-09-08 NOTE — Telephone Encounter (Signed)
Called patient to see how she is doing on Wegovy 0.5 mg. She is due to increase to 1 mg today. She reports tolerating this well with the only adverse effect being some mild stomach pain which has since improved. She was able to use the copay savings card to pay $0 for her prescription.  ? ?Will follow up with her next at office visit in 4 weeks on 10/06/21 which is when she will be due to increase to 1.7 mg.  ?

## 2021-09-22 ENCOUNTER — Ambulatory Visit
Admission: EM | Admit: 2021-09-22 | Discharge: 2021-09-22 | Disposition: A | Discharge status: Home / Self Care | Payer: No Typology Code available for payment source | Attending: Internal Medicine | Admitting: Internal Medicine

## 2021-09-22 ENCOUNTER — Encounter: Payer: Self-pay | Admitting: Emergency Medicine

## 2021-09-22 ENCOUNTER — Ambulatory Visit (INDEPENDENT_AMBULATORY_CARE_PROVIDER_SITE_OTHER): Payer: No Typology Code available for payment source

## 2021-09-22 DIAGNOSIS — M25571 Pain in right ankle and joints of right foot: Secondary | ICD-10-CM | POA: Diagnosis not present

## 2021-09-22 DIAGNOSIS — M79671 Pain in right foot: Secondary | ICD-10-CM

## 2021-09-22 NOTE — ED Triage Notes (Signed)
Patient states that she woke up Saturday morning with right ankle pain.  No apparent injury, no history of gout or arthritis.  Patient denies any OTC pain meds. ?

## 2021-09-22 NOTE — Discharge Instructions (Signed)
You have several chronic and generative changes of the right ankle.  A boot has been applied.  Please use ice application and elevate.  Follow-up with provided contact information for orthopedist for further evaluation and management. ?

## 2021-09-22 NOTE — ED Provider Notes (Signed)
?[facility] ? ? ? ?CSN: [id number] ?Arrival date & time: 09/22/21  1248 ? ? ?  ? ?History   ?Chief Complaint ?Chief Complaint  ?Patient presents with  ? Ankle Pain  ? ? ?HPI ?Nicole Frazier is a 61 y.o. female.  ? ?Patient presents with right ankle pain/foot pain that started approximately 4 days ago.  Denies any apparent injury.  Patient denies any history of arthritis or gout.  Denies any associated fevers.  Patient can bear weight but pain occurs with bearing weight.  Patient has not taken any medications for pain.  Denies any numbness or tingling.  Pain is present in the dorsal surface of the foot at proximal foot and anterior ankle pain. ? ? ?Ankle Pain ? ?Past Medical History:  ?Diagnosis Date  ? Arthritis   ? knees  ? Hypertension   ? SVT (supraventricular tachycardia) (HCC)   ? recurrent  ? ? ?Patient Active Problem List  ? Diagnosis Date Noted  ? SVT/ PSVT/ PAT 02/26/2010  ? OBESITY 03/10/2007  ? Essential hypertension 03/10/2007  ? ? ?Past Surgical History:  ?Procedure Laterality Date  ? COLONOSCOPY N/A 01/15/2016  ? Procedure: COLONOSCOPY;  Surgeon: Graylin ShiverSalem F Ganem, MD;  Location: [facility];  Service: Endoscopy;  Laterality: N/A;  ? KNEE SURGERY Bilateral yrs ago  ? arthroscopic  ? ? ?OB History   ?No obstetric history on file. ?  ? ? ? ?Home Medications   ? ?Prior to Admission medications   ?Medication Sig Start Date End Date Taking? Authorizing Provider  ?amLODipine (NORVASC) 10 MG tablet Take 1 tablet (10 mg total) by mouth daily. 07/14/21  Yes Duke SalviaKlein, Steven C, MD  ?chlorthalidone (HYGROTON) 25 MG tablet Take 1.5 tablets (37.5 mg total) by mouth daily. 12/02/20  Yes Sheilah PigeonUrsuy, Renee Lynn, PA-C  ?labetalol (NORMODYNE) 200 MG tablet Take 1 tablet (200 mg total) by mouth 2 (two) times daily. 03/09/21  Yes Duke SalviaKlein, Steven C, MD  ?propranolol (INDERAL) 10 MG tablet Take one tablet (10 mg total) by mouth as needed for SVT. Take as directed by Dr. Graciela HusbandsKlein. 08/21/18  Yes Duke SalviaKlein, Steven C, MD  ?Semaglutide-Weight  Management 1 MG/0.5ML SOAJ Inject 1 mg into the skin once a week for 28 days. 09/08/21 10/06/21 Yes Duke SalviaKlein, Steven C, MD  ?Semaglutide-Weight Management 1.7 MG/0.75ML SOAJ Inject 1.7 mg into the skin once a week for 28 days. 10/06/21 11/03/21 Yes Duke SalviaKlein, Steven C, MD  ?Semaglutide-Weight Management 2.4 MG/0.75ML SOAJ Inject 2.4 mg into the skin once a week. 11/09/21  Yes Duke SalviaKlein, Steven C, MD  ?potassium chloride (KLOR-CON) 10 MEQ tablet Take 2 tablets (20 mEq total) by mouth daily. 10/02/20 07/14/21  Sheilah PigeonUrsuy, Renee Lynn, PA-C  ? ? ?Family History ?Family History  ?Problem Relation Age of Onset  ? Hypertension Mother   ? Hypertension Father   ? Hypertension Sister   ? Hypertension Brother   ? Hypertension Brother   ? Diabetes Neg Hx   ? ? ?Social History ?Social History  ? ?Tobacco Use  ? Smoking status: Never  ? Smokeless tobacco: Never  ?Vaping Use  ? Vaping Use: Never used  ?Substance Use Topics  ? Alcohol use: No  ? Drug use: No  ? ? ? ?Allergies   ?Patient has no known allergies. ? ? ?Review of Systems ?Review of Systems ?Per HPI ? ?Physical Exam ?Triage Vital Signs ?ED Triage Vitals  ?Enc Vitals Group  ?   BP 09/22/21 1334 (!) 135/93  ?   Pulse Rate 09/22/21  1334 85  ?   Resp 09/22/21 1334 18  ?   Temp 09/22/21 1334 98.2 ?F (36.8 ?C)  ?   Temp Source 09/22/21 1334 Oral  ?   SpO2 09/22/21 1334 95 %  ?   Weight 09/22/21 1336 (!) 329 lb (149.2 kg)  ?   Height 09/22/21 1336 5\' 6"  (1.676 m)  ?   Head Circumference --   ?   Peak Flow --   ?   Pain Score 09/22/21 1336 5  ?   Pain Loc --   ?   Pain Edu? --   ?   Excl. in GC? --   ? ?No data found. ? ?Updated Vital Signs ?BP (!) 135/93 (BP Location: Left Arm)   Pulse 85   Temp 98.2 ?F (36.8 ?C) (Oral)   Resp 18   Ht 5\' 6"  (1.676 m)   Wt (!) 329 lb (149.2 kg)   SpO2 95%   BMI 53.10 kg/m?  ? ?Visual Acuity ?Right Eye Distance:   ?Left Eye Distance:   ?Bilateral Distance:   ? ?Right Eye Near:   ?Left Eye Near:    ?Bilateral Near:    ? ?Physical Exam ?Constitutional:   ?   General:  She is not in acute distress. ?   Appearance: Normal appearance. She is not toxic-appearing or diaphoretic.  ?HENT:  ?   Head: Normocephalic and atraumatic.  ?Eyes:  ?   Extraocular Movements: Extraocular movements intact.  ?   Conjunctiva/sclera: Conjunctivae normal.  ?Pulmonary:  ?   Effort: Pulmonary effort is normal.  ?Musculoskeletal:  ?   Comments: Tenderness to palpation to dorsal surface of right foot directly below anterior ankle.  Tenderness to palpation generalized throughout ankle.  Mild swelling noted.  No discoloration or erythema noted.  Patient has full range of motion of ankle, foot, toes.  Neurovascular intact.  No calf pain.  ?Neurological:  ?   General: No focal deficit present.  ?   Mental Status: She is alert and oriented to person, place, and time. Mental status is at baseline.  ?Psychiatric:     ?   Mood and Affect: Mood normal.     ?   Behavior: Behavior normal.     ?   Thought Content: Thought content normal.     ?   Judgment: Judgment normal.  ? ? ? ?UC Treatments / Results  ?Labs ?(all labs ordered are listed, but only abnormal results are displayed) ?Labs Reviewed - No data to display ? ?EKG ? ? ?Radiology ?DG Ankle Complete Right ? ?Result Date: 09/22/2021 ?CLINICAL DATA:  Ankle pain. EXAM: RIGHT ANKLE - COMPLETE 3+ VIEW COMPARISON:  Foot evaluation of the same date. FINDINGS: Well circumscribed bony fragments about the medial malleolus. Soft tissue swelling in this area. No sign of acute fracture. Ankle mortise is preserved with degenerative changes about the ankle joint. Signs of Achilles and plantar enthesopathy upon the calcaneus. Well-circumscribed calcific densities about the tail end of a killer joint as well. Midfoot degenerative changes. IMPRESSION: 1. Degenerative changes and soft tissue swelling without acute fracture. 2. Bony fragments, well-circumscribed about the medial malleolus and talar navicular joint likely sequela of prior trauma and or loose bodies in the setting of  degenerative change. Correlate with any point tenderness in these areas. Electronically Signed   By: M.D.   On: 09/22/2021 14:02  ? ?DG Foot Complete Right ? ?Result Date: 09/22/2021 ?CLINICAL DATA:  Ankle pain, RIGHT foot and  ankle pain without history of injury by report. EXAM: RIGHT FOOT COMPLETE - 3+ VIEW COMPARISON:  Ankle evaluation of the same date. FINDINGS: Osteopenia. Soft tissue swelling about the foot mainly about the hindfoot and ankle. Marked degenerative change about the ankle. No signs of acute fracture or dislocation about the foot. Soft tissue swelling also along the plantar surface of the foot. IMPRESSION: Soft tissue swelling about the foot and ankle. No signs of acute fracture or dislocation. Electronically Signed   By: Donzetta Kohut M.D.   On: 09/22/2021 13:59   ? ?Procedures ?Procedures (including critical care time) ? ?Medications Ordered in UC ?Medications - No data to display ? ?Initial Impression / Assessment and Plan / UC Course  ?I have reviewed the triage vital signs and the nursing notes. ? ?Pertinent labs & imaging results that were available during my care of the patient were reviewed by me and considered in my medical decision making (see chart for details). ? ?  ? ?Right ankle and foot x-ray showing several degenerative and chronic changes.  X-ray showed possible past trauma but patient denied any apparent trauma in the past.  Will place cam boot for support.  Discussed supportive care, ice application, over-the-counter pain relievers.  Patient to follow-up with provided contact information for orthopedist if pain persists or worsens.  Discussed return precautions.  Patient verbalized understanding and was agreeable plan. ?Final Clinical Impressions(s) / UC Diagnoses  ? ?Final diagnoses:  ?Acute right ankle pain  ? ? ? ?Discharge Instructions   ? ?  ?You have several chronic and generative changes of the right ankle.  A boot has been applied.  Please use ice  application and elevate.  Follow-up with provided contact information for orthopedist for further evaluation and management. ? ? ? ? ?ED Prescriptions   ?None ?  ? ?PDMP not reviewed this encounter. ?  ?[place], [place]

## 2021-09-24 ENCOUNTER — Ambulatory Visit: Payer: No Typology Code available for payment source | Admitting: Physician Assistant

## 2021-09-24 ENCOUNTER — Encounter: Payer: Self-pay | Admitting: Physician Assistant

## 2021-09-24 DIAGNOSIS — M19071 Primary osteoarthritis, right ankle and foot: Secondary | ICD-10-CM | POA: Diagnosis not present

## 2021-09-24 MED ORDER — MELOXICAM 15 MG PO TABS: 15.0000 mg | ORAL_TABLET | Freq: Every day | ORAL | 0 refills | Status: AC

## 2021-09-24 NOTE — Progress Notes (Signed)
? ?Office Visit Note ?  ?Patient: Nicole Frazier           ?Date of Birth: [birth year]           ?MRN: [id number] ?Visit Date: 09/24/2021 ?             ?Requested by: No referring provider defined for this encounter. ?PCP: Pcp, No ? ?Chief Complaint  ?Patient presents with  ? Right Ankle - Pain  ? ? ? ? ?HPI: ?Patient is a pleasant 61 year old woman with a 2-day history of anterior ankle pain.  She denies any injuries.  She denies any remote injuries.  She denies any personal or family history of gout.  She said she woke up and started having pain focally in the front of her ankle.  She did not notice any significant swelling or erythema.  She did present to urgent care where x-rays obtained and she was placed in a cam walker boot.  She is not wearing this today.  She says pain has gotten slightly better.  She has a history of being a borderline diabetic.  She does not have any vascular history. ? ?Assessment & Plan: ?Visit Diagnoses: Arthritis right foot and ankle. ? ?Plan: I had a long discussion with the patient.  We will encourage her to wear her boot or stiff shoe is much as possible.  I have called in a prescription for meloxicam.  She is not to take other anti-inflammatories with this.  Also have given her information about Voltaren gel.  Her x-rays did show quite a bit of fragmentation in the talonavicular joint and the navicular tarsal joint.  Also fragmentation of the medial malleolus.  She will follow-up in 3 weeks with Dr. Sharol Given ? ?Follow-Up Instructions: Return in about 3 weeks (around 10/15/2021).  ? ?Ortho Exam ? ?Patient is alert, oriented, no adenopathy, well-dressed, normal affect, normal respiratory effort. ?Examination of her right ankle she has a palpable dorsalis pedis pulse.  She has good plantarflexion dorsiflexion eversion inversion strength.  There is no cellulitis no sign of infection.  She does have pain with flexion of her ankle over the anterior talonavicular joint and anterior ankle.  Some  pain to palpation over the distal medial malleolus.  Does not have pain with subtalar range of motion.  Compartments are soft and compressible.  Has good sensation per her report in her foot ? ?Imaging: ?No results found. ?No images are attached to the encounter. ? ?Labs: ?Lab Results  ?Component Value Date  ? HGBA1C 6.0 (H) 07/14/2021  ? ? ? ?Lab Results  ?Component Value Date  ? ALBUMIN 4.5 07/28/2020  ? ? ?Lab Results  ?Component Value Date  ? MG 1.9 04/15/2011  ? ?No results found for: VD25OH ? ?No results found for: PREALBUMIN ? ?  Latest Ref Rng & Units 04/07/2014  ?  7:47 AM 04/15/2011  ?  8:25 AM 02/11/2010  ?  6:35 AM  ?CBC EXTENDED  ?WBC 4.0 - 10.5 K/uL 12.7   8.0     ?RBC 3.87 - 5.11 MIL/uL 4.89   5.26     ?Hemoglobin 12.0 - 15.0 g/dL 13.1   13.7   14.6    ?HCT 36.0 - 46.0 % 40.0   42.3   43.0    ?Platelets 150 - 400 K/uL 421   352     ?NEUT# 1.7 - 7.7 K/uL 7.7      ?Lymph# 0.7 - 4.0 K/uL 3.5      ? ? ? ?  There is no height or weight on file to calculate BMI. ? ?Orders:  ?No orders of the defined types were placed in this encounter. ? ?Meds ordered this encounter  ?Medications  ? meloxicam (MOBIC) 15 MG tablet  ?  Sig: Take 1 tablet (15 mg total) by mouth daily.  ?  Dispense:  30 tablet  ?  Refill:  0  ? ? ? Procedures: ?No procedures performed ? ?Clinical Data: ?No additional findings. ? ?ROS: ? ?All other systems negative, except as noted in the HPI. ?Review of Systems ? ?Objective: ?Vital Signs: There were no vitals taken for this visit. ? ?Specialty Comments:  ?No specialty comments available. ? ?PMFS History: ?Patient Active Problem List  ? Diagnosis Date Noted  ? SVT/ PSVT/ PAT 02/26/2010  ? OBESITY 03/10/2007  ? Essential hypertension 03/10/2007  ? ?Past Medical History:  ?Diagnosis Date  ? Arthritis   ? knees  ? Hypertension   ? SVT (supraventricular tachycardia) ([place])   ? recurrent  ?  ?Family History  ?Problem Relation Age of Onset  ? Hypertension Mother   ? Hypertension Father   ? Hypertension  Sister   ? Hypertension Brother   ? Hypertension Brother   ? Diabetes Neg Hx   ?  ?Past Surgical History:  ?Procedure Laterality Date  ? COLONOSCOPY N/A 01/15/2016  ? Procedure: COLONOSCOPY;  Surgeon: Wonda Horner, MD;  Location: [facility];  Service: Endoscopy;  Laterality: N/A;  ? KNEE SURGERY Bilateral yrs ago  ? arthroscopic  ? ?Social History  ? ?Occupational History  ?  Employer: [employer]  ?Tobacco Use  ? Smoking status: Never  ? Smokeless tobacco: Never  ?Vaping Use  ? Vaping Use: Never used  ?Substance and Sexual Activity  ? Alcohol use: No  ? Drug use: No  ? Sexual activity: Not on file  ? ? ? ? ? ?

## 2021-10-05 NOTE — Progress Notes (Signed)
Patient ID: Nicole Frazier                 DOB: 1961/01/31                      MRN: [id number] ? ? ? ? ?HPI: ?Nicole Frazier is a 61 y.o. female patient of Dr. Graciela Husbands referred by Francis Dowse, PA-C to HTN clinic. PMH is significant for HTN, SVT, obesity. Has been seen in the past by HTN clinic and also discussed starting [facility] but she had changed her mind and opted to pursue diet/exercise changes alone. At HTN clinic visit 07/14/21, she was interested in starting [place]. BP at that time was elevated 152/92 and amlodipine increased. At last visit 08/11/21, BP was much improved to 128/82.  ? ?Today, patient arrives in good spirits. Reports no GI adverse effects with FMBWGY. She had mild nausea when first starting a few months ago but that resolved. Does report feeling more tired than usual in the past few months and thinks it may be related to [facility]. She still wants to continue to titrate the dose up to see if the fatigue improves like the nausea did. She does report having a smaller appetite and having to make herself eat meals since she does not have hunger but when she does eat she has no GI symptoms. She ate more than usual for a few days to see if the tiredness improved and was related to eating less but the tiredness did not change. She has not noticed any worsening of fatigue with dose increase. She has not needed to use any propranolol for SVT but has noticed occasional instances of elevated HR that only last for a couple minutes and resolve on their own without medication.  ? ?Current HTN meds: amlodipine 10 mg daily, chlorthalidone 37.5 mg daily, labetalol 200 mg BID, potassium chloride 20 mEq daily, propranolol 10mg  prn SVT ?Previously tried: losartan-HCTZ (swelling in feet) ?BP goal: <130/80 mmHg ? ?Current weight management medications: Wegovy  ?Previously tried meds: none ?Current meds that may affect weight: none ?Baseline weight/BMI: 342.8 lb (BMI 55.33) ?Current weight/BMI: 328.6 lb (BMI 53.04) ?Insurance  payor: [employer]. [place] approved through 07/15/22. Copay is $25 - educated about $0 copay card.  ? ?Family History: The patient's family history includes Hypertension in her brother, brother, father, mother, and sister. ? ?Social History: The patient reports that she has never smoked. She has never used smokeless tobacco. She reports that she does not drink alcohol and does not use drugs.  ? ?Diet: Does not cook with or add salt to foods (has none in her house), only eats foods with salt when she is at a restaurant. Used to drink 2 cups coffee/day - now no longer drinking any. Down to drinking no soda in a week.  ?Breakfast: skips (wakes up at 4:30am) ?Lunch: salad or sandwich (eats around 10am) ?Dinner: meat and sides ?Portions overall are much smaller ? ?Exercise: Walking more lately compared to last visit but has had ankle pain recently  ? ?Home BP readings: She got a new BP cuff (wrist cuff) - reports readings 138/100 which is higher than clinic cuff readings. Not using correct technique. Her dad has started using her wrist cuff because it is easier to use than his upper arm cuff so she is planning to start using his.  ? ?Labs:  ?-07/14/21: Scr 0.96, Na 140, K 3.6 (chlorthalidone 37.5 mg daily, potassium chloride 20 mEq daily) ? ?Wt Readings from Last  3 Encounters:  ?10/06/21 (!) 328 lb 9.6 oz (149.1 kg)  ?09/22/21 (!) 329 lb (149.2 kg)  ?08/11/21 (!) 340 lb 9.6 oz (154.5 kg)  ? ?BP Readings from Last 3 Encounters:  ?10/06/21 132/80  ?09/22/21 (!) 135/93  ?08/11/21 128/82  ? ?Pulse Readings from Last 3 Encounters:  ?10/06/21 85  ?09/22/21 85  ?08/11/21 84  ? ?Renal function: ?CrCl cannot be calculated (Patient's most recent lab result is older than the maximum 21 days allowed.). ? ?Past Medical History:  ?Diagnosis Date  ? Arthritis   ? knees  ? Hypertension   ? SVT (supraventricular tachycardia) (HCC)   ? recurrent  ? ?Current Outpatient Medications on File Prior to Visit  ?Medication Sig Dispense Refill  ? amLODipine  (NORVASC) 10 MG tablet Take 1 tablet (10 mg total) by mouth daily. 90 tablet 3  ? chlorthalidone (HYGROTON) 25 MG tablet Take 1.5 tablets (37.5 mg total) by mouth daily. 135 tablet 3  ? labetalol (NORMODYNE) 200 MG tablet Take 1 tablet (200 mg total) by mouth 2 (two) times daily. 180 tablet 1  ? meloxicam (MOBIC) 15 MG tablet Take 1 tablet (15 mg total) by mouth daily. 30 tablet 0  ? potassium chloride (KLOR-CON) 10 MEQ tablet Take 2 tablets (20 mEq total) by mouth daily. 180 tablet 3  ? propranolol (INDERAL) 10 MG tablet Take one tablet (10 mg total) by mouth as needed for SVT. Take as directed by Dr. Graciela Husbands. 30 tablet 2  ? Semaglutide-Weight Management 1 MG/0.5ML SOAJ Inject 1 mg into the skin once a week for 28 days. 2 mL 0  ? Semaglutide-Weight Management 1.7 MG/0.75ML SOAJ Inject 1.7 mg into the skin once a week for 28 days. 3 mL 0  ? [START ON 11/09/2021] Semaglutide-Weight Management 2.4 MG/0.75ML SOAJ Inject 2.4 mg into the skin once a week. 3 mL 11  ? ?No current facility-administered medications on file prior to visit.  ? ?No Known Allergies ? ?Assessment/Plan: ? ?1. Hypertension - BP is close to goal <130/80 mmHg. Will continue current medications: amlodipine 10 mg daily, chlorthalidone 37.5 mg daily, labetalol 200 mg BID, potassium chloride 20 mEq daily, propranolol 10mg  prn SVT. Encouraged her to continue checking BP at home. Instructed to bring cuff to next visit in August.  ? ?2. Weight loss - Weight is down 14 lbs (4.1%) after 3 months on Wegovy. Due to increase to 1.7 mg today. She would like to continue with the titration as planned. Nausea has resolved but is now experiencing fatigue and a smaller appetite. Extensively discussed diet habits and encouraged her to try to eat smaller snacks throughout the day. Discussed that we can slow down the titration if needed. She will monitor her symptoms of tiredness and let 07-16-1990 know if it gets worse with dose increase.  ? ?W[place]ovy Titration Plan:  ?Will plan to  follow the titration plan as below, pending patient is tolerating each dose before increasing to the next. Can slow titration if needed for tolerability.  ?-Month 1: Inject Wegovy 0.25 mg once weekly x 4 weeks ?-Month 2: Inject Wegovy 0.5 mg once weekly x 4 weeks ?-Month 3: Inject Wegovy 1 mg once weekly x 4 weeks ?-Month 4: Inject Wegovy 1.7mg  SQ once weekly x 4 weeks ?-Month 5+: Inject Wegovy 2.4mg  SQ once weekly  ? ?Follow up in 1 month by phone at time of next [facility] dose increase. Next visit in August for [facility] 6 month visit and BP check.  ? ?[facility],  PharmD ?PGY2 Ambulatory Care Pharmacy Resident ?10/06/2021 3:07 PM ? ?

## 2021-10-06 ENCOUNTER — Ambulatory Visit: Payer: No Typology Code available for payment source | Admitting: Student-PharmD

## 2021-10-06 VITALS — BP 132/80 | HR 85 | Wt 328.6 lb

## 2021-10-06 DIAGNOSIS — Z6841 Body Mass Index (BMI) 40.0 and over, adult: Secondary | ICD-10-CM

## 2021-10-06 DIAGNOSIS — I10 Essential (primary) hypertension: Secondary | ICD-10-CM | POA: Diagnosis not present

## 2021-10-06 NOTE — Patient Instructions (Addendum)
Great seeing you today! ? ?Increase to Wegovy 1.7 mg today. Give me a call with any issues: [phone]. Otherwise I will call you in 4 weeks to see if we are ready to go up to the maintenance dose of 2.4 mg weekly.  ? ?Next visit in 3 months.  ? ?Tips for living a healthier life ? ? ? ? ?Building a Chief of Staff Diet ?Make most of your meal vegetables and fruits - ? of your plate. ?Aim for color and variety, and remember that potatoes don?t count as vegetables on the Healthy Eating Plate because of their negative impact on blood sugar. ? ?Go for whole grains - ? of your plate. ?Whole and intact grains--whole wheat, barley, wheat berries, quinoa, oats, brown rice, and foods made with them, such as whole wheat pasta--have a milder effect on blood sugar and insulin than white bread, white rice, and other refined grains. ? ?Protein power - ? of your plate. ?Fish, poultry, beans, and nuts are all healthy, versatile protein sources--they can be mixed into salads, and pair well with vegetables on a plate. Limit red meat, and avoid processed meats such as bacon and sausage. ? ?Healthy plant oils - in moderation. ?Choose healthy vegetable oils like olive, canola, soy, corn, sunflower, peanut, and others, and avoid partially hydrogenated oils, which contain unhealthy trans fats. Remember that low-fat does not mean ?healthy.? ? ?Drink water, coffee, or tea. ?Skip sugary drinks, limit milk and dairy products to one to two servings per day, and limit juice to a small glass per day. ? ?Stay active. ?The red figure running across the Healthy Eating Plate?s placemat is a reminder that staying active is also important in weight control. ? ?The main message of the Healthy Eating Plate is to focus on diet quality: ? ?The type of carbohydrate in the diet is more important than the amount of carbohydrate in the diet, because some sources of carbohydrate--like vegetables (other than potatoes), fruits, whole grains, and  beans--are healthier than others. ?The Healthy Eating Plate also advises consumers to avoid sugary beverages, a major source of calories--usually with little nutritional value--in the American diet. ?The Healthy Eating Plate encourages consumers to use healthy oils, and it does not set a maximum on the percentage of calories people should get each day from healthy sources of fat. In this way, the Healthy Eating Plate recommends the opposite of the low-fat message promoted for decades by the USDA. ? ?[employer] ? ?SUGAR ? ?Sugar is a huge problem in the modern day diet. Sugar is a big contributor to heart disease, diabetes, high triglyceride levels, fatty liver disease and obesity. Sugar is hidden in almost all packaged foods/beverages. Added sugar is extra sugar that is added beyond what is naturally found and has no nutritional benefit for your body. The American Heart Association recommends limiting added sugars to no more than 25g for women and 36 grams for men per day. There are many names for sugar including maltose, sucrose (names ending in "ose"), high fructose corn syrup, molasses, cane sugar, corn sweetener, raw sugar, syrup, honey or fruit juice concentrate.  ? ?One of the best ways to limit your added sugars is to stop drinking sweetened beverages such as soda, sweet tea, and fruit juice. ? ?There is 65g of added sugars in one 20oz bottle of Coke! That is equal to 7.5 donuts.  ? ?Pay attention and read all nutrition facts labels. Below is an examples of a nutrition facts label. The #1 is  showing you the total sugars where the # 2 is showing you the added sugars. This one serving has almost the max amount of added sugars per day! ? ? ? ? ?20 oz Soda ?65g Sugar = 7.5 Glazed Donuts ? ?16oz Energy  ?Drink ?54g Sugar = 6.5 Glazed Donuts ? ?Large Sweet  ?Tea ?38g Sugar = 4 Glazed Donuts ? ?20oz Sports  ?Drink ?34g Sugar = 3.5 Glazed Donuts ? ?8oz Chocolate  Milk ?24g Sugar =2.5 Glazed Donuts ? ?8oz Orange  ?Juice ?21g Sugar = 2 Glazed Donuts ? ?1 Juice Box ?14g Sugar = 1.5 Glazed Donuts ? ?16oz Water= NO SUGAR!! ? ?EXERCISE ? ?Exercise is good. We?ve all heard that. In an ideal world, we would all have time and resources to get plenty of it. When you are active, your heart pumps more efficiently and you will feel better.  Multiple studies show that even walking regularly has benefits that include living a longer life. The American Heart Association recommends 150 minutes per week of exercise (30 minutes per day most days of the week). You can do this in any increment you wish. Nine or more 10-minute walks count. So does an hour-long exercise class. Break the time apart into what will work in your life. Some of the best things you can do include walking briskly, jogging, cycling or swimming laps. Not everyone is ready to ?exercise.? Sometimes we need to start with just getting active. Here are some easy ways to be more active throughout the day: ? Take the stairs instead of the elevator ? Go for a 10-15 minute walk during your lunch break (find a friend to make it more enjoyable) ? When shopping, park at the back of the parking lot ? If you take public transportation, get off one stop early and walk the extra distance ? Pace around while making phone calls ? ?Check with your doctor if you aren?t sure what your limitations may be. Always remember to drink plenty of water when doing any type of exercise. Don?t feel like a failure if you?re not getting the 90-150 minutes per week. If you started by being a couch potato, then just a 10-minute walk each day is a huge improvement. Start with little victories and work your way up. ? ? ?HEALTHY EATING TIPS ? ?When looking to improve your eating habits, whether to lose weight, lower blood pressure or just be healthier, it helps to know what a serving size is.  ? ?Grains ?1 slice of bread, ? bagel, ? cup pasta or  rice  Vegetables ?1 cup fresh or raw vegetables, ? cup cooked or canned ?Fruits ?1 piece of medium sized fruit, ? cup canned,   Meats/Proteins ?? cup dried       1 oz meat, 1 egg, ? cup cooked beans, nuts or seeds ? ?Dairy        Fats ?Individual yogurt container, 1 cup (8oz)    1 teaspoon margarine/butter or vegetable  ?milk or milk alternative, 1 slice of cheese          oil; 1 tablespoon mayonnaise or salad dressing                 ? ?Plan ahead: make a menu of the meals for a week then create a grocery list to go with that menu. Consider meals that easily stretch into a night of leftovers, such as stews or casseroles. Or consider making two of your favorite meal and put one in  the freezer for another night. Try a night or two each week that is ?meatless? or ?no cook? such as salads. When you get home from the grocery store wash and prepare your vegetables and fruits. Then when you need them they are ready to go.  ? ?Tips for going to the grocery store: ? University store or generic brands ? Check the weekly ad from your store on-line or in their in-store flyer ? Look at the unit price on the shelf tag to compare/contrast the costs of different items ? Buy fruits/vegetables in season ? Carrots, bananas and apples are low-cost, naturally healthy items ? If meats or frozen vegetables are on sale, buy some extras and put in your freezer ? Limit buying prepared or ?ready to eat? items, even if they are pre-made salads or fruit snacks ? Do not shop when you?re hungry ? Foods at eye level tend to be more expensive. Look on the high and low shelves for deals. ? Consider shopping at the farmer?s market for fresh foods in season. ? Avoid the cookie and chip aisles (these are expensive, high in calories and low in nutritional value). Shop on the outside of the grocery store. ? ?Healthy food preparations: ? If you can?t get lean hamburger, be sure to drain the fat when cooking ? Steam, saut? (in olive oil), grill or bake foods ?  Experiment with different seasonings to avoid adding salt to your foods. Kosher salt, sea salt and Himalayan salt are all still salt and should be avoided. Try seasoning food with onion, garlic, thyme, rosemary, basil ect. On

## 2021-10-15 ENCOUNTER — Ambulatory Visit: Payer: No Typology Code available for payment source | Admitting: Orthopedic Surgery

## 2021-10-27 ENCOUNTER — Telehealth: Payer: Self-pay | Admitting: Student-PharmD

## 2021-10-27 MED ORDER — WEGOVY 1.7 MG/0.75ML ~~LOC~~ SOAJ: 1.7000 mg | SUBCUTANEOUS | 0 refills | Status: DC

## 2021-10-27 NOTE — Telephone Encounter (Signed)
Called patient to see how she is doing since increasing Wegovy to 1.7 mg at last visit. She had reported having a very low appetite last visit but she wanted to continue with the dose titration as planned since her other side effects have resolved with time. Today, she reports tolerating this dose well and said it is interesting because now she feels like she is always hungry, whereas on the last dose she did not feel very hungry at all. She said once she starts eating she gets full quickly so she stops eating when she is full and is controlling her portions that way. She would like to stay on the 1.7 mg dose for an additional month. She has 1 pen left for today then will need a new box for next week. I have sent this in for her. Will follow up with her in 3 weeks per patient request to see if she would like to increase to 2.4 mg at that time.   Her 6 month visit with the pharmacy team is scheduled for August. She is due for follow up with EP. She said she will call to schedule this soon.

## 2021-11-13 ENCOUNTER — Other Ambulatory Visit: Payer: Self-pay

## 2021-11-13 MED ORDER — LABETALOL HCL 200 MG PO TABS: 200.0000 mg | ORAL_TABLET | Freq: Two times a day (BID) | ORAL | 1 refills | Status: DC

## 2021-11-13 MED ORDER — POTASSIUM CHLORIDE ER 10 MEQ PO TBCR: 20.0000 meq | EXTENDED_RELEASE_TABLET | Freq: Every day | ORAL | 1 refills | Status: DC

## 2021-11-17 ENCOUNTER — Telehealth: Payer: Self-pay | Admitting: Student-PharmD

## 2021-11-17 MED ORDER — WEGOVY 1.7 MG/0.75ML ~~LOC~~ SOAJ: 1.7000 mg | SUBCUTANEOUS | 2 refills | Status: DC

## 2021-11-17 NOTE — Telephone Encounter (Signed)
Called patient to follow up on how she is doing on Wegovy. We have continued on 1.7 mg weekly rather than increasing to 2.4 mg per patient preference since her appetite has continued to be small and she has been experiencing some fatigue. We have discussed decreasing the dose in the past if she would like but she has had these symptoms since starting and wanted to continue the titration. She reports doing well today. Like last phone call, she reports feeling hungrier on the 1.7 mg dose than 1 mg but still gets full more quickly and stops eating sooner. She would like to continue on the 1.7 mg dose for now and revisit discussion to increase to 2.4 mg at her next visit on 01/05/22. I have sent in refills of the 1.7 mg dose for her. Encouraged her to call in the meantime if she has any questions or concerns.

## 2021-11-30 ENCOUNTER — Encounter: Payer: Self-pay | Admitting: Physician Assistant

## 2021-11-30 ENCOUNTER — Ambulatory Visit: Payer: No Typology Code available for payment source | Admitting: Physician Assistant

## 2021-11-30 VITALS — BP 130/90 | HR 83 | Ht 66.0 in | Wt 318.6 lb

## 2021-11-30 DIAGNOSIS — Z6841 Body Mass Index (BMI) 40.0 and over, adult: Secondary | ICD-10-CM

## 2021-11-30 DIAGNOSIS — I471 Supraventricular tachycardia, unspecified: Secondary | ICD-10-CM

## 2021-11-30 DIAGNOSIS — E66813 Obesity, class 3: Secondary | ICD-10-CM

## 2021-11-30 DIAGNOSIS — I10 Essential (primary) hypertension: Secondary | ICD-10-CM | POA: Diagnosis not present

## 2021-12-28 ENCOUNTER — Telehealth: Payer: Self-pay | Admitting: Physician Assistant

## 2021-12-28 NOTE — Telephone Encounter (Signed)
I called pt insurance who states patient has a paid claim from today. I called pt pharmacy who states she does have a paid claim today for $24.99 and they do have in stock. Patient made aware and was appreciative of the help.

## 2021-12-28 NOTE — Telephone Encounter (Signed)
Pt c/o medication issue:  1. Name of Medication: WEGOVY 1.7 MG/0.75ML SOAJ  2. How are you currently taking this medication (dosage and times per day)? As written  3. Are you having a reaction (difficulty breathing--STAT)? no  4. What is your medication issue? Pt states her pharmacy told her they can't refill the same dosage of this medication. She states she is supposed to take this medication tomorrow and would like a call back today.

## 2022-01-05 ENCOUNTER — Ambulatory Visit: Payer: No Typology Code available for payment source | Admitting: Pharmacist

## 2022-01-05 VITALS — BP 126/88 | HR 86 | Wt 315.8 lb

## 2022-01-05 DIAGNOSIS — Z6841 Body Mass Index (BMI) 40.0 and over, adult: Secondary | ICD-10-CM | POA: Diagnosis not present

## 2022-01-05 DIAGNOSIS — I10 Essential (primary) hypertension: Secondary | ICD-10-CM | POA: Diagnosis not present

## 2022-01-05 NOTE — Progress Notes (Signed)
Patient ID: Nicole Frazier                 DOB: Aug 25, 1960                      MRN: [id number]     HPI: Nicole Frazier is a 61 y.o. female patient of Dr. Graciela Husbands referred by Francis Dowse, PA-C to HTN clinic. PMH is significant for HTN, SVT, obesity. Has been seen in the past by HTN clinic and also discussed starting [facility] but she had changed her mind and opted to pursue diet/exercise changes alone. At HTN clinic visit 07/14/21, she was interested in starting [place]. BP at that time was elevated 152/92 and amlodipine increased. At visit 08/11/21, BP was much improved to 128/82. She was started on Wegovy.  Patient seen today for her 6 month Wegovy follow up and blood pressure check. She hasn't checked her blood pressure in 2 weeks. But states that her diastolic has been between 80-90 at home. She states the [facility] does make her tired some, but its not as bad. She is trying to make herself do things. Started riding the bike at work on her breaks. Also trying to find time to walk as well. Has been trying to remind herself to eat. Is better at eating on the 1.7 than the 1mg . She does not want to increase Wegovy to 2.4mg .   Current HTN meds: amlodipine 10 mg daily, chlorthalidone 37.5 mg daily, labetalol 200 mg BID, potassium chloride 20 mEq daily, propranolol 10mg  prn SVT Previously tried: losartan-HCTZ (swelling in feet) BP goal: <130/80 mmHg  Current weight management medications: Wegovy 1.7mg  weekly Previously tried meds: none Current meds that may affect weight: none Baseline weight/BMI: 342.8 lb (BMI 55.33)342 Current weight/BMI: 315.8 lb (BMI 50.97) Insurance payor: [employer]. [place] approved through 07/15/22. Copay is $25 - educated about $0 copay card.   Family History: The patient's family history includes Hypertension in her brother, brother, father, mother, and sister.  Social History: The patient reports that she has never smoked. She has never used smokeless tobacco. She reports that she does not drink  alcohol and does not use drugs.   Diet: Does not cook with or add salt to foods (has none in her house), only eats foods with salt when she is at a restaurant. Used to drink 2 cups coffee/day - now no longer drinking any. Down to drinking no soda in a week.  Breakfast: yogurt and fruit or nothing Lunch: zaxybs salads, brings lunch 3 times a week Dinner: meat and sides Portions overall are much smaller  Exercise: Riding stationary bike at work on breaks 2 x a day 3 times a week. Starting to walk- trying to figure out how she can walk at work  Home BP readings: She got a new BP cuff (wrist cuff) - reports readings 138/100 which is higher than clinic cuff readings. Not using correct technique. Her dad has started using her wrist cuff because it is easier to use than his upper arm cuff so she is planning to start using his.   Labs:  -07/14/21: Scr 0.96, Na 140, K 3.6 (chlorthalidone 37.5 mg daily, potassium chloride 20 mEq daily)  Wt Readings from Last 3 Encounters:  11/30/21 (!) 318 lb 9.6 oz (144.5 kg)  10/06/21 (!) 328 lb 9.6 oz (149.1 kg)  09/22/21 (!) 329 lb (149.2 kg)   BP Readings from Last 3 Encounters:  11/30/21 130/90  10/06/21 132/80  09/22/21 (!) 135/93  Pulse Readings from Last 3 Encounters:  11/30/21 83  10/06/21 85  09/22/21 85   Renal function: CrCl cannot be calculated (Patient's most recent lab result is older than the maximum 21 days allowed.).  Past Medical History:  Diagnosis Date   Arthritis    knees   Hypertension    SVT (supraventricular tachycardia) (HCC)    recurrent   Current Outpatient Medications on File Prior to Visit  Medication Sig Dispense Refill   amLODipine (NORVASC) 10 MG tablet Take 1 tablet (10 mg total) by mouth daily. 90 tablet 3   chlorthalidone (HYGROTON) 25 MG tablet Take 1.5 tablets (37.5 mg total) by mouth daily. 135 tablet 3   labetalol (NORMODYNE) 200 MG tablet Take 1 tablet (200 mg total) by mouth 2 (two) times daily. 180 tablet  1   meloxicam (MOBIC) 15 MG tablet Take 1 tablet (15 mg total) by mouth daily. 30 tablet 0   potassium chloride (KLOR-CON) 10 MEQ tablet Take 2 tablets (20 mEq total) by mouth daily. 180 tablet 1   propranolol (INDERAL) 10 MG tablet Take one tablet (10 mg total) by mouth as needed for SVT. Take as directed by Dr. Graciela Husbands. 30 tablet 2   WEGOVY 1.7 MG/0.75ML SOAJ Inject 1.7 mg into the skin once a week. 3 mL 2   No current facility-administered medications on file prior to visit.   No Known Allergies  Assessment/Plan:  1. Hypertension - BP is close to goal <130/80 mmHg. I gave her the option of increasing labetalol, but patient wished to continue to work on exercise as this is relatively new for her. Will continue current medications: amlodipine 10 mg daily, chlorthalidone 37.5 mg daily, labetalol 200 mg BID, potassium chloride 20 mEq daily, propranolol 10mg  prn SVT. Encouraged her to continue checking BP at home. Instructed to bring cuff to next visit in August. Follow up in 4 weeks.  2. Weight loss - Weight is down 27 lbs (7.8%) after 6 months on Wegovy. She does not wish to increase to 2.4mg  currently. Continue Wegovy 1.7mg  weekly. Continue to increase exercise. Will get another lipid panel and A1C next week.  01/05/2022 3:50 PM

## 2022-01-05 NOTE — Patient Instructions (Addendum)
Increase your exercise as tolerated Continue monitoring blood pressure at home. Please bring in readings and cuff to next visit Continue amlodipine 10 mg daily, chlorthalidone 37.5 mg daily, labetalol 200 mg BID, potassium chloride 20 mEq daily, propranolol 10mg  prn SVT

## 2022-01-11 ENCOUNTER — Other Ambulatory Visit: Payer: No Typology Code available for payment source

## 2022-01-11 LAB — LIPID PANEL
Chol/HDL Ratio: 4 ratio (ref 0.0–4.4)
Cholesterol, Total: 178 mg/dL (ref 100–199)
HDL: 44 mg/dL (ref >39)
LDL Chol Calc (NIH): 109 mg/dL — ABNORMAL HIGH (ref 0–99)
Triglycerides: 139 mg/dL (ref 0–149)
VLDL Cholesterol Cal: 25 mg/dL (ref 5–40)

## 2022-01-11 LAB — HEMOGLOBIN A1C
Est. average glucose Bld gHb Est-mCnc: 117 mg/dL
Hgb A1c MFr Bld: 5.7 % — ABNORMAL HIGH (ref 4.8–5.6)

## 2022-02-02 NOTE — Progress Notes (Unsigned)
Patient ID: Nicole Frazier                 DOB: May 13, 1961                      MRN: [id number]     HPI: Nicole Frazier is a 61 y.o. female patient of Dr. Graciela Husbands referred by Francis Dowse, PA-C to HTN clinic. PMH is significant for HTN, SVT, obesity. Has been seen in the past by HTN clinic and also discussed starting [facility] but she had changed her mind and opted to pursue diet/exercise changes alone. At HTN clinic visit 07/14/21, she was interested in starting [place]. BP at that time was elevated 152/92 and amlodipine increased. At visit 08/11/21, BP was much improved to 128/82. She was started on Wegovy.  Patient seen 01/05/22 for her 6 month Wegovy follow up and blood pressure check. She states that [facility] does make her tired some, but its not as bad as before. She has started riding an stationary exercise bike at work on her breaks and trying to walk as well. Patient also said appetite has been better on 1.7 mg than 1 mg and did not wish to increase to 2.4 mg. Weight was down 27 lbs (7.8%) after 6 months on Wegovy. Pt was continued on Wegovy 1.7mg  weekly.   BP was close to goal at 126/88 in clinic, recommended to increase labetalol but pt preferred to continue working on exercise since she recently started working on exercising more. Pt was continued on amlodipine 10 mg daily, chlorthalidone 37.5 mg daily, labetalol 200 mg BID, potassium chloride 20 mEq daily, propranolol 10mg  prn SVT and encouraged to bring cuff at next visit.  Pt presents for HTN follow-up today. She did not bring her blood pressure cuff today as she recently moved and it is packed away in a box. She is still using the stationary bike 3x a week for 15-20 minutes. She has not really started walking at work but she is trying. It is hard since her job requires her to be at her desk for most of the day. She is still experiencing diarrhea on Wegovy but this is not worse than before and she is tolerating it. She also shares that her appetite comes  and goes, some days it is better than others.   Current HTN meds: amlodipine 10 mg daily, chlorthalidone 37.5 mg daily, labetalol 200 mg BID, potassium chloride 20 mEq daily, propranolol 10mg  prn SVT Previously tried: losartan-HCTZ (swelling in feet) BP goal: <130/80 mmHg  Current weight management medications: Wegovy 1.7mg  weekly Previously tried meds: none Current meds that may affect weight: none Baseline weight/BMI: 342.8 lb (BMI 55.33)342 Current weight/BMI: 315.8 lb (BMI 50.97) Insurance pay[place]: [employer] through 07/15/22. Copay is $25 - educated about $0 copay card.   Family History: The patient's family history includes Hypertension in her brother, brother, father, mother, and sister.  Social History: The patient reports that she has never smoked. She has never used smokeless tobacco. She reports that she does not drink alcohol and does not use drugs.   Diet: Does not cook with or add salt to foods (has none in her house), only eats foods with salt when she is at a restaurant. Used to drink 2 cups coffee/day - now no longer drinking any. Down to drinking no soda in a week.  Breakfast: yogurt and fruit or nothing Lunch: zaxbys salads, brings lunch 3 times a week Dinner: meat and sides Portions overall  are much smaller She has not had red meat in the past month   Exercise: Riding stationary bike at work on breaks 2 x a day 3 times a week. Starting to walk- trying to figure out how she can walk at work  Home BP readings: She got a new BP cuff (wrist cuff) - reports readings 138/100 which is higher than clinic cuff readings. Not using correct technique. Her dad has started using her wrist cuff because it is easier to use than his upper arm cuff so she is planning to start using his.   BP at work today (02/03/22) was 131/88 - did not rest before taking   Labs:  -07/14/21: Scr 0.96, Na 140, K 3.6 (chlorthalidone 37.5 mg daily, potassium chloride 20 mEq daily)  Wt Readings  from Last 3 Encounters:  02/03/22 (!) 309 lb 9.6 oz (140.4 kg)  01/05/22 (!) 315 lb 12.8 oz (143.2 kg)  11/30/21 (!) 318 lb 9.6 oz (144.5 kg)   BP Readings from Last 3 Encounters:  02/03/22 124/88  01/05/22 126/88  11/30/21 130/90   Pulse Readings from Last 3 Encounters:  02/03/22 86  01/05/22 86  11/30/21 83   Renal function: CrCl cannot be calculated (Patient's most recent lab result is older than the maximum 21 days allowed.).  Past Medical History:  Diagnosis Date   Arthritis    knees   Hypertension    SVT (supraventricular tachycardia) (HCC)    recurrent   Current Outpatient Medications on File Prior to Visit  Medication Sig Dispense Refill   amLODipine (NORVASC) 10 MG tablet Take 1 tablet (10 mg total) by mouth daily. 90 tablet 3   chlorthalidone (HYGROTON) 25 MG tablet Take 1.5 tablets (37.5 mg total) by mouth daily. 135 tablet 3   meloxicam (MOBIC) 15 MG tablet Take 1 tablet (15 mg total) by mouth daily. 30 tablet 0   potassium chloride (KLOR-CON) 10 MEQ tablet Take 2 tablets (20 mEq total) by mouth daily. 180 tablet 1   propranolol (INDERAL) 10 MG tablet Take one tablet (10 mg total) by mouth as needed for SVT. Take as directed by Dr. Graciela Husbands. 30 tablet 2   WEGOVY 1.7 MG/0.75ML SOAJ Inject 1.7 mg into the skin once a week. 3 mL 2   No current facility-administered medications on file prior to visit.   No Known Allergies  Assessment/Plan:  1. Hypertension - BP 124/88 above goal of <130/80. Pt agrees to increasing labetalol.  Will increase labetalol to 300 mg BID. Instructed patient to continue taking  amlodipine 10 mg daily, chlorthalidone 37.5 mg daily, potassium chloride 20 mEq daily, and propranolol 10mg  as needed for SVT. Also reminded patient to continue checking blood pressures at home and remember to bring BP cuff in at follow up on 10/4.   2. Hyperlipidemia- Informed patient about LDL of 109, above goal of 12/4. ASCVD risk score 6.9%. Pt is not interested in  starting statin for primary prevention at this time.   3. Weight loss- Pt will continue Wegovy 1.7 mg weekly. Pt will continue to use exercise bike and try to walk more throughout the week.   <287, PharmD PGY1 Pharmacy Resident   Seen by PGY1 resident. Agree with plan.  Larena Sox, PharmD, BCACP, CDCES, CPP [facility] [address], [place], [place] Phone: [phone]; Fax: [phone] 02/03/2022 4:31 PM   02/03/2022  4:26 PM

## 2022-02-03 ENCOUNTER — Encounter: Payer: Self-pay | Admitting: Pharmacist

## 2022-02-03 ENCOUNTER — Ambulatory Visit: Payer: No Typology Code available for payment source | Attending: Cardiology | Admitting: Pharmacist

## 2022-02-03 VITALS — BP 124/88 | HR 86 | Wt 309.6 lb

## 2022-02-03 DIAGNOSIS — I10 Essential (primary) hypertension: Secondary | ICD-10-CM | POA: Diagnosis not present

## 2022-02-03 MED ORDER — LABETALOL HCL 300 MG PO TABS: 300.0000 mg | ORAL_TABLET | Freq: Two times a day (BID) | ORAL | 3 refills | Status: AC

## 2022-02-03 NOTE — Patient Instructions (Addendum)
Your blood pressure today was 124/88. We would like it to be <130/80.  Stop taking labetalol 200 mg twice daily.   Start taking labetalol 300 mg twice daily. Continue taking amlodipine 10 mg daily, chlorthalidone 37.5 mg daily, potassium chloride 20 mEq daily, and propranolol 10mg  as needed for SVT.  Continue taking blood pressure at home and write down readings. Remember to bring in blood pressure cuff to next visit on 10/4.   Continue using exercise bike at work and try to get some walking in.

## 2022-02-22 ENCOUNTER — Ambulatory Visit: Payer: No Typology Code available for payment source | Admitting: Family Medicine

## 2022-02-23 ENCOUNTER — Other Ambulatory Visit: Payer: Self-pay

## 2022-02-23 ENCOUNTER — Other Ambulatory Visit: Payer: Self-pay | Admitting: Pharmacist

## 2022-02-23 DIAGNOSIS — I10 Essential (primary) hypertension: Secondary | ICD-10-CM

## 2022-02-23 MED ORDER — WEGOVY 1.7 MG/0.75ML ~~LOC~~ SOAJ: 1.7000 mg | SUBCUTANEOUS | 2 refills | Status: DC

## 2022-02-23 MED ORDER — CHLORTHALIDONE 25 MG PO TABS: 37.5000 mg | ORAL_TABLET | Freq: Every day | ORAL | 2 refills | Status: AC

## 2022-03-10 ENCOUNTER — Ambulatory Visit: Payer: No Typology Code available for payment source | Attending: Internal Medicine | Admitting: Pharmacist

## 2022-03-10 ENCOUNTER — Encounter: Payer: Self-pay | Admitting: Pharmacist

## 2022-03-10 DIAGNOSIS — I10 Essential (primary) hypertension: Secondary | ICD-10-CM | POA: Diagnosis not present

## 2022-03-10 NOTE — Patient Instructions (Addendum)
Continue current medication and continue checking BP at home (goal <130/80).

## 2022-03-10 NOTE — Progress Notes (Signed)
Patient ID: Nicole Frazier                 DOB: 1960/08/16                      MRN: [id number]      HPI: Nicole Frazier is a 61 y.o. female referred by Dr. Caryl Comes to HTN clinic. PMH is significant for  HTN, SVT, obesity. Has been seen in the past by HTN clinic and also discussed starting [facility] but she had changed her mind and opted to pursue diet/exercise changes alone. At HTN clinic visit 07/14/21, she was interested in starting [place]. BP at that time was elevated 152/92 and amlodipine increased. At visit 08/11/21, BP was much improved to 128/82. She was started on Wegovy.  Patient presents today in good spirits. Forgot home cuff but had a physical at work last week and reports blood pressure was well controlled. Is tolerating increase in labetalol well.  No adverse effects.  Reports she would like to stay on Wegovy 1.7mg   Current HTN meds:  Amlodipine 10mg  daily Chlorthalidone 37.5mg  daily Labetalol 300mg  BID Propranolol 10mg  PRN SVT  Previously tried: losartan-HCTZ (swelling in feet)  BP goal: <130/80  Family History:  The patient's family history includes Hypertension in her brother, brother, father, mother, and sister.  Home BP readings: home BP ~ 130/89 (home cuff is not validated) but work place health check ~120/82  Wt Readings from Last 3 Encounters:  03/10/22 (!) 301 lb 6.4 oz (136.7 kg)  02/03/22 (!) 309 lb 9.6 oz (140.4 kg)  01/05/22 (!) 315 lb 12.8 oz (143.2 kg)   BP Readings from Last 3 Encounters:  03/10/22 128/78  02/03/22 124/88  01/05/22 126/88   Pulse Readings from Last 3 Encounters:  03/10/22 87  02/03/22 86  01/05/22 86    Renal function: CrCl cannot be calculated (Patient's most recent lab result is older than the maximum 21 days allowed.).  Past Medical History:  Diagnosis Date   Arthritis    knees   Hypertension    SVT (supraventricular tachycardia) (HCC)    recurrent    Current Outpatient Medications on File Prior to Visit  Medication Sig  Dispense Refill   amLODipine (NORVASC) 10 MG tablet Take 1 tablet (10 mg total) by mouth daily. 90 tablet 3   chlorthalidone (HYGROTON) 25 MG tablet Take 1.5 tablets (37.5 mg total) by mouth daily. 135 tablet 2   labetalol (NORMODYNE) 300 MG tablet Take 1 tablet (300 mg total) by mouth 2 (two) times daily. 180 tablet 3   meloxicam (MOBIC) 15 MG tablet Take 1 tablet (15 mg total) by mouth daily. 30 tablet 0   potassium chloride (KLOR-CON) 10 MEQ tablet Take 2 tablets (20 mEq total) by mouth daily. 180 tablet 1   propranolol (INDERAL) 10 MG tablet Take one tablet (10 mg total) by mouth as needed for SVT. Take as directed by Dr. Caryl Comes. 30 tablet 2   WEGOVY 1.7 MG/0.75ML SOAJ Inject 1.7 mg into the skin once a week. 3 mL 2   No current facility-administered medications on file prior to visit.    No Known Allergies  Hypertension  Assessment: BP controlled - 128/78 in office (Goal <130/80)  Patients reports tolerating mediations well without side effects   Plan:  continue current medications  Amlodipine 10mg  by mouth daily Chlorthalidone 37.5mg  by mouth daily Labetalol 300mg  by mouth BID Propranolol 10mg  by mouth PRN SVT  Follow up as needed  Laural Golden, PharmD, BCACP, CDCES, CPP [facility] [address], [place], [place] [address] Phone: [phone]; Fax: [phone] 03/10/2022 4:43 PM

## 2022-03-10 NOTE — Assessment & Plan Note (Signed)
Assessment:  BP controlled - 128/78 in office (Goal <130/80)   Patients reports tolerating mediations well without side effects   Plan:  continue current medications   Amlodipine 10mg  by mouth daily  Chlorthalidone 37.5mg  by mouth daily  Labetalol 300mg  by mouth BID  Propranolol 10mg  by mouth PRN SVT

## 2022-05-11 ENCOUNTER — Ambulatory Visit: Payer: No Typology Code available for payment source | Admitting: Family Medicine

## 2022-05-19 ENCOUNTER — Other Ambulatory Visit: Payer: Self-pay | Admitting: Pharmacist

## 2022-05-19 MED ORDER — WEGOVY 1.7 MG/0.75ML ~~LOC~~ SOAJ: 1.7000 mg | SUBCUTANEOUS | 11 refills | Status: DC

## 2022-06-08 NOTE — Progress Notes (Unsigned)
Cardiology Office Note Date:  06/09/2022  Patient ID:  Nicole Frazier, DOB [birth year], MRN [id number] PCP:  Pcp, No  Cardiologist:  None Electrophysiologist: Virl Axe, MD   Chief Complaint: 50mon routine follow-up  History of Present Illness: Nicole Frazier is a 62 y.o. female with PMH notable for HTN, SVT; seen today for Virl Axe, MD for routine electrophysiology followup. Since last being seen in our clinic the patient reports doing well.   Last saw PA Charlcie Cradle 11/2021, had started using stationary bike for exercise. Had a couple of fleeing bursts of fast HRs, no syncope.   Today, she states she has rare episodes of fast HR, had one episode in December 2023, but had not had any before that in many months. Took PRN propanolol with resolution of symptoms. During episodes has some SOB, no CP or pre-syncope.  .  she denies chest pain, palpitations, dyspnea, PND, orthopnea, nausea, vomiting, dizziness, syncope, edema, weight gain, or early satiety.    She is overall satisfied with her level of control.   Has new PCP appt this month sometime. GYN has been ordering routine health screening tests.   Past Medical History:  Diagnosis Date   Arthritis    knees   Hypertension    SVT (supraventricular tachycardia)    recurrent    Past Surgical History:  Procedure Laterality Date   COLONOSCOPY N/A 01/15/2016   Procedure: COLONOSCOPY;  Surgeon: Wonda Horner, MD;  Location: [facility];  Service: Endoscopy;  Laterality: N/A;   KNEE SURGERY Bilateral yrs ago   arthroscopic    Current Outpatient Medications  Medication Sig Dispense Refill   amLODipine (NORVASC) 10 MG tablet Take 1 tablet (10 mg total) by mouth daily. 90 tablet 3   chlorthalidone (HYGROTON) 25 MG tablet Take 1.5 tablets (37.5 mg total) by mouth daily. 135 tablet 2   labetalol (NORMODYNE) 300 MG tablet Take 1 tablet (300 mg total) by mouth 2 (two) times daily. 180 tablet 3   meloxicam (MOBIC) 15 MG tablet Take 1 tablet  (15 mg total) by mouth daily. 30 tablet 0   potassium chloride (KLOR-CON) 10 MEQ tablet Take 2 tablets (20 mEq total) by mouth daily. 180 tablet 1   propranolol (INDERAL) 10 MG tablet Take one tablet (10 mg total) by mouth as needed for SVT. Take as directed by Dr. Caryl Comes. 30 tablet 2   WEGOVY 1.7 MG/0.75ML SOAJ Inject 1.7 mg into the skin once a week. 3 mL 11   No current facility-administered medications for this visit.    Allergies:   Patient has no known allergies.   Social History:  The patient  reports that she has never smoked. She has never used smokeless tobacco. She reports that she does not drink alcohol and does not use drugs.   Family History:  The patient's family history includes Hypertension in her brother, brother, father, mother, and sister.  ROS:  Please see the history of present illness. All other systems are reviewed and otherwise negative.   PHYSICAL EXAM:  VS:  BP (!) 126/98   Pulse 77   Ht 5\' 6"  (1.676 m)   Wt 294 lb 6.4 oz (133.5 kg)   SpO2 96%   BMI 47.52 kg/m  BMI: Body mass index is 47.52 kg/m.  GEN- The patient is well appearing, alert and oriented x 3 today.   HEENT: normocephalic, atraumatic; sclera clear, conjunctiva pink; hearing intact; oropharynx clear; neck supple, no JVP Lungs- Clear to ausculation bilaterally, normal work of  breathing.  No wheezes, rales, rhonchi Heart- Regular rate and rhythm, no murmurs, rubs or gallops, PMI not laterally displaced GI- soft, non-tender, non-distended, bowel sounds present, no hepatosplenomegaly Extremities- Trace peripheral edema. no clubbing or cyanosis; DP/PT/radial pulses 2+ bilaterally MS- no significant deformity or atrophy Skin- warm and dry, no rash or lesion Psych- euthymic mood, full affect Neuro- strength and sensation are intact   EKG is ordered. Personal review of EKG from today shows:  NSR with PAC, rate 77bpm  Recent Labs: 07/14/2021: BUN 14; Creatinine, Ser 0.96; Potassium 3.6; Sodium 140   01/11/2022: Chol/HDL Ratio 4.0; Cholesterol, Total 178; HDL 44; LDL Chol Calc (NIH) 109; Triglycerides 139   CrCl cannot be calculated (Patient's most recent lab result is older than the maximum 21 days allowed.).   Wt Readings from Last 3 Encounters:  06/09/22 294 lb 6.4 oz (133.5 kg)  03/10/22 (!) 301 lb 6.4 oz (136.7 kg)  02/03/22 (!) 309 lb 9.6 oz (140.4 kg)     Additional studies reviewed include: Previous EP notes.   ASSESSMENT AND PLAN:  #) SVT  PSVT Well-controlled with current meds.  Rarely needing PRN propanolol   #) HTN at goal today.     Current medicines are reviewed at length with the patient today.   The patient does not have concerns regarding her medicines.  The following changes were made today:  none  Labs/ tests ordered today include:  Orders Placed This Encounter  Procedures   EKG 12-Lead     Disposition: Follow up with Dr. Caryl Comes or APP in 6 months   Signed, Mamie Levers, NP  06/09/22 4:33 PM   [address] [place] [address] [phone] (office)  [phone] (fax)

## 2022-06-09 ENCOUNTER — Ambulatory Visit: Payer: No Typology Code available for payment source | Attending: Physician Assistant | Admitting: Cardiology

## 2022-06-09 ENCOUNTER — Encounter: Payer: Self-pay | Admitting: Cardiology

## 2022-06-09 VITALS — BP 126/98 | HR 77 | Ht 66.0 in | Wt 294.4 lb

## 2022-06-09 DIAGNOSIS — I10 Essential (primary) hypertension: Secondary | ICD-10-CM | POA: Diagnosis not present

## 2022-06-09 DIAGNOSIS — I471 Supraventricular tachycardia, unspecified: Secondary | ICD-10-CM

## 2022-06-09 NOTE — Patient Instructions (Signed)
Medication Instructions:   Your physician recommends that you continue on your current medications as directed. Please refer to the Current Medication list given to you today.  *If you need a refill on your cardiac medications before your next appointment, please call your pharmacy*   Lab Work: [place]   If you have labs (blood work) drawn today and your tests are completely normal, you will receive your results only by: [place] (if you have MyChart) OR A paper copy in the mail If you have any lab test that is abnormal or we need to change your treatment, we will call you to review the results.   Testing/Procedures: NONE ORDERED  TODAY    Follow-Up: At [facility], you and your health needs are our priority.  As part of our continuing mission to provide you with exceptional heart care, we have created designated Provider Care Teams.  These Care Teams include your primary Cardiologist (physician) and Advanced Practice Providers (APPs -  Physician Assistants and Nurse Practitioners) who all work together to provide you with the care you need, when you need it.  We recommend signing up for the patient portal called "MyChart".  Sign up information is provided on this After Visit Summary.  MyChart is used to connect with patients for Virtual Visits (Telemedicine).  Patients are able to view lab/test results, encounter notes, upcoming appointments, etc.  Non-urgent messages can be sent to your provider as well.   To learn more about what you can do with MyChart, go to [email or web address].    Your next appointment:   6 month(s)  The format for your next appointment:   In Person  Provider:   You may see Virl Axe, MD or one of the following Advanced Practice Providers on your designated Care Team:   Tommye Standard, [place] Legrand Como "Jonni Sanger" Chalmers Cater, [place]  Other Instructions   Important Information About Sugar

## 2022-06-14 ENCOUNTER — Other Ambulatory Visit: Payer: Self-pay

## 2022-06-14 MED ORDER — POTASSIUM CHLORIDE ER 10 MEQ PO TBCR: 20.0000 meq | EXTENDED_RELEASE_TABLET | Freq: Every day | ORAL | 1 refills | Status: AC

## 2022-07-09 ENCOUNTER — Encounter: Payer: Self-pay | Admitting: Pharmacist

## 2022-07-09 MED ORDER — WEGOVY 2.4 MG/0.75ML ~~LOC~~ SOAJ: 2.4000 mg | SUBCUTANEOUS | 11 refills | Status: DC

## 2022-07-14 ENCOUNTER — Telehealth: Payer: Self-pay | Admitting: Pharmacist

## 2022-07-14 MED ORDER — WEGOVY 2.4 MG/0.75ML ~~LOC~~ SOAJ: 2.4000 mg | SUBCUTANEOUS | 11 refills | Status: AC

## 2022-07-14 NOTE — Telephone Encounter (Signed)
Received fax from pharmacy that PA is needed for [facility]. PA needs to be submitted on [email or web address]. Baseline weight before starting med was 345 lbs (BMI 55.68) on 07/07/20. Current weight 294 lbs (BMI 47.52) at cardiology visit on 06/09/22. Request submitted, prior auth # [id number].

## 2022-07-14 NOTE — Telephone Encounter (Signed)
PA approved through 01/12/23. Approval letter scanned into media tab. Refill sent to pharmacy.

## 2022-08-12 ENCOUNTER — Other Ambulatory Visit: Payer: Self-pay | Admitting: Internal Medicine

## 2022-08-12 DIAGNOSIS — I10 Essential (primary) hypertension: Secondary | ICD-10-CM

## 2022-09-06 DEATH — deceased

## 2022-09-16 ENCOUNTER — Ambulatory Visit: Payer: No Typology Code available for payment source | Admitting: Family Medicine

## 2023-03-14 IMAGING — DX DG FOOT COMPLETE 3+V*R*
3 series · 3 of 3 positions shown · non-contrast
Comparison: Ankle evaluation of the same date.

CLINICAL DATA: Ankle pain, RIGHT foot and ankle pain without
history of injury by report.

EXAM:
RIGHT FOOT COMPLETE - 3+ VIEW

[foot supine dp]
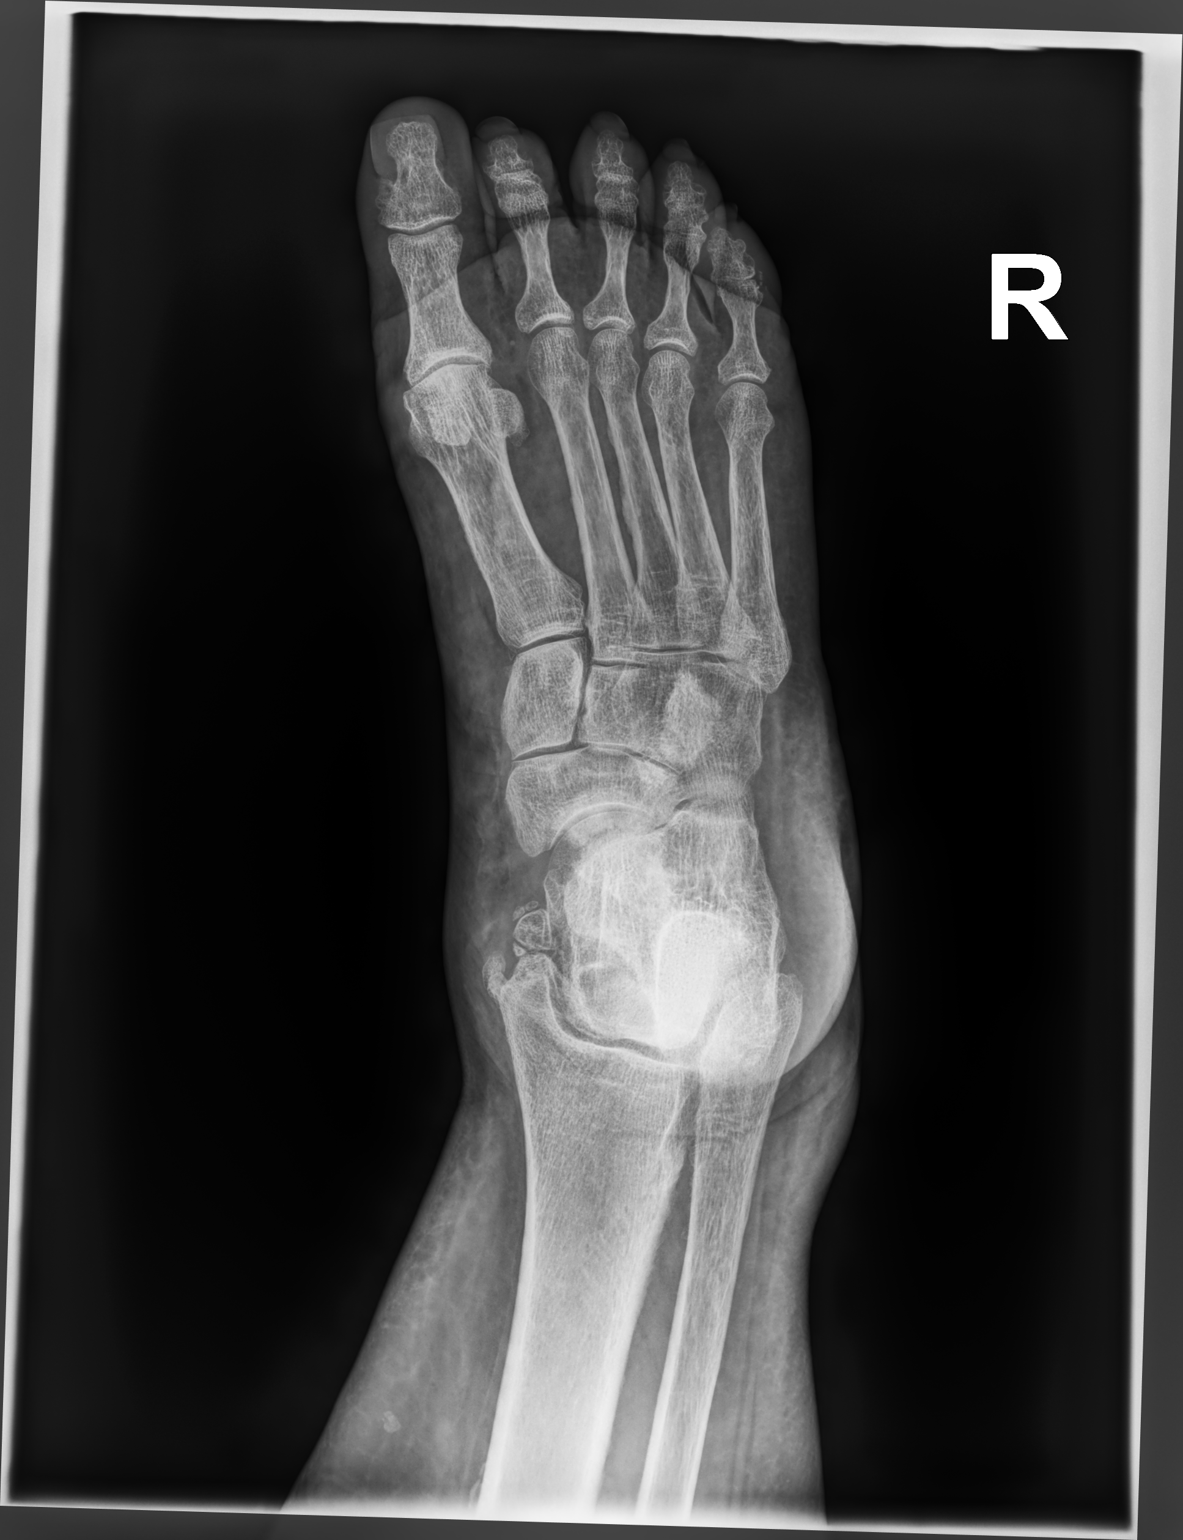

[foot medial oblique]
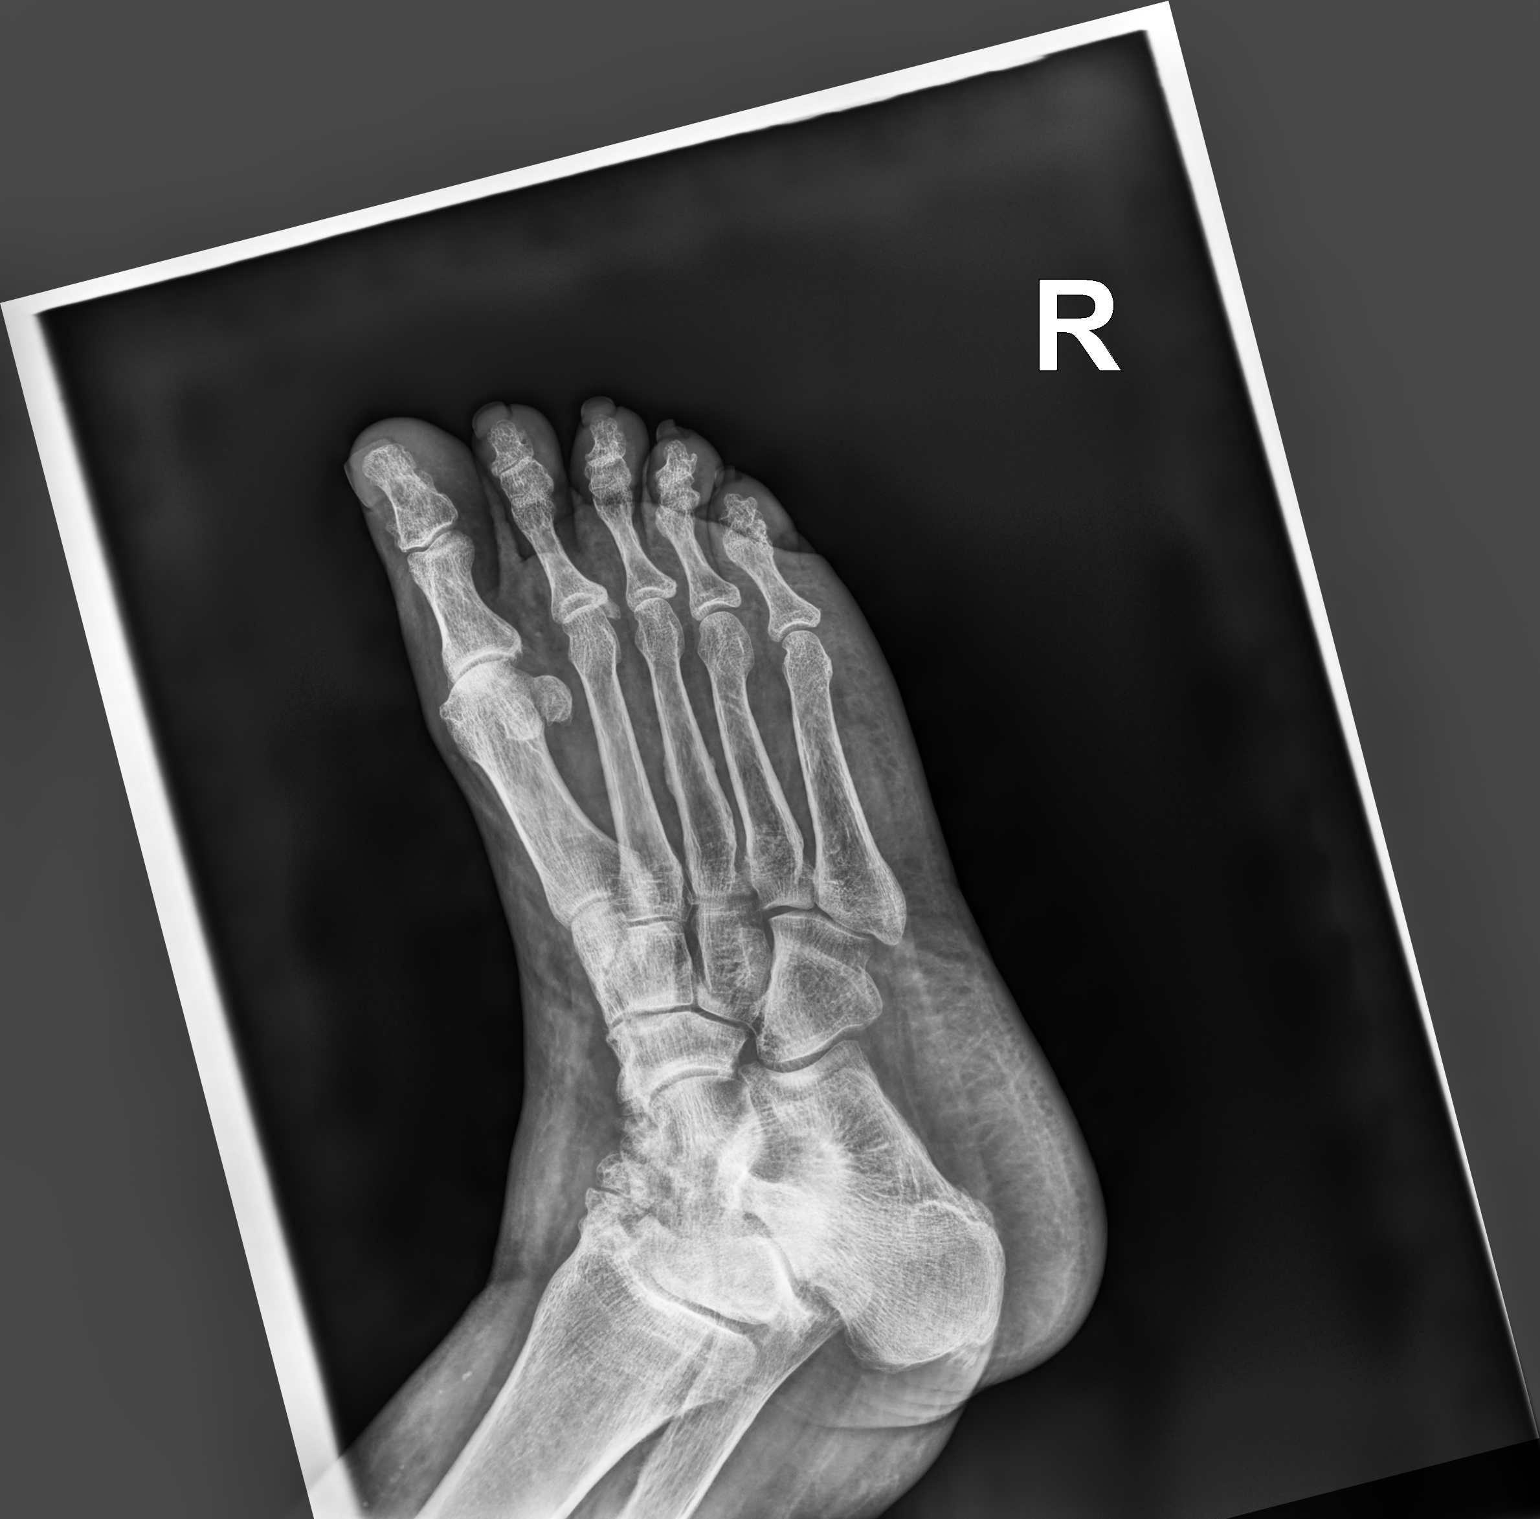

[foot supine lat]
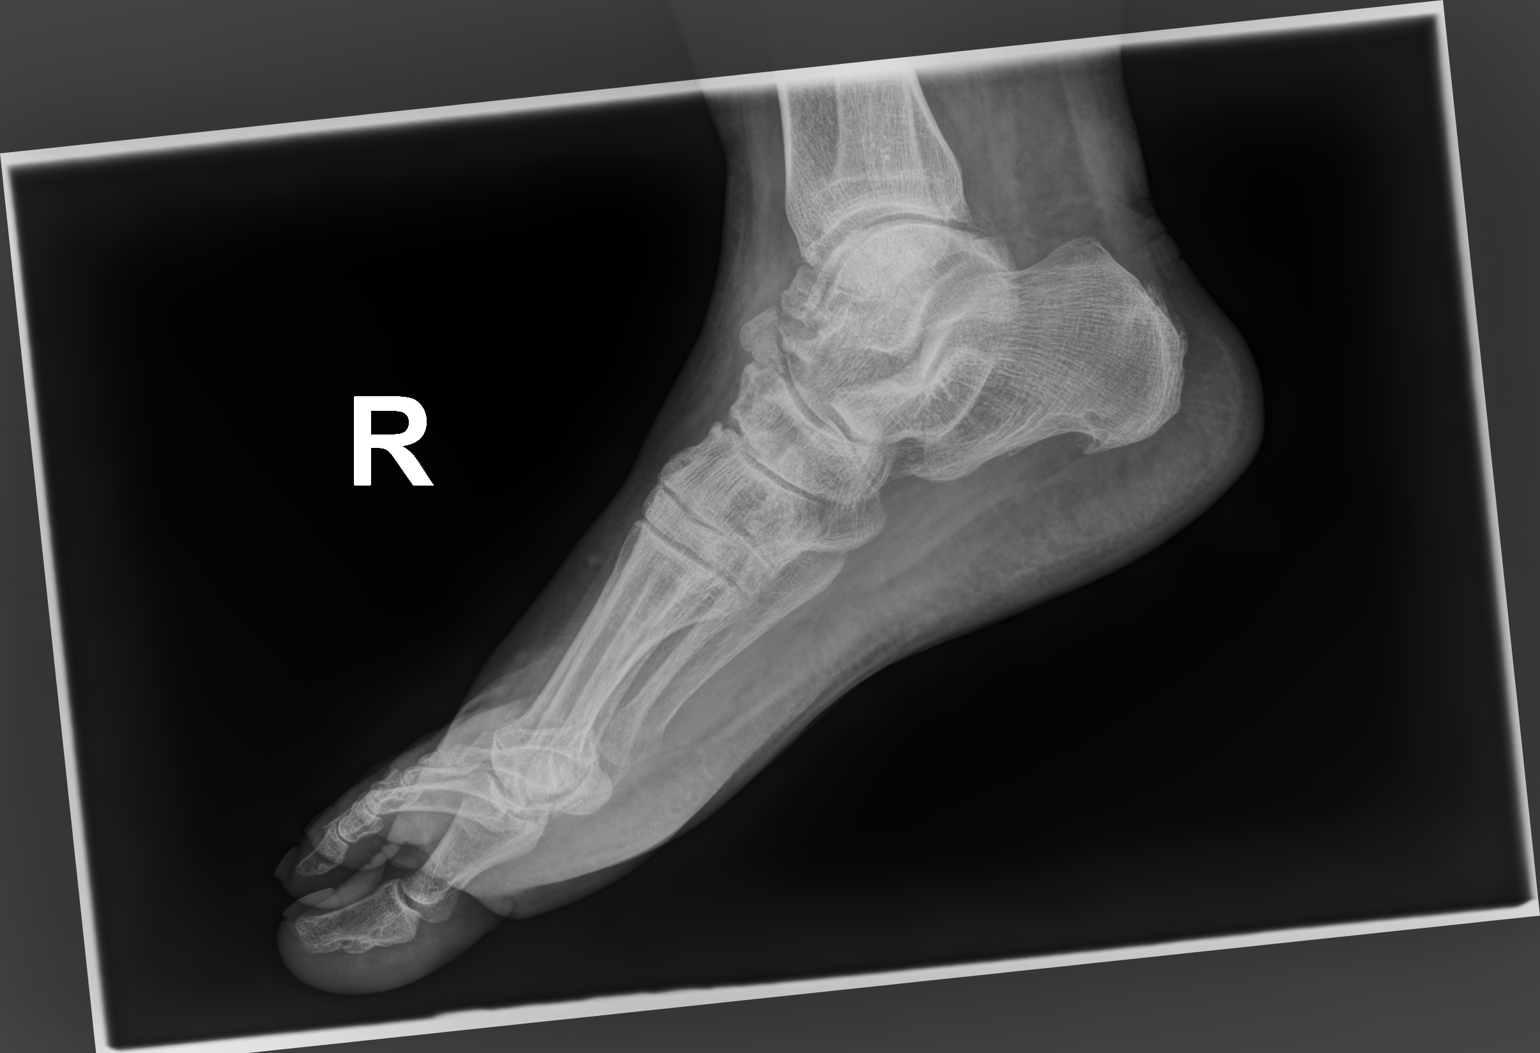

[3 of 3 positions shown; findings below may reference images not displayed]

FINDINGS: Osteopenia.

Soft tissue swelling about the foot mainly about the hindfoot and
ankle. Marked degenerative change about the ankle. No signs of acute
fracture or dislocation about the foot. Soft tissue swelling also
along the plantar surface of the foot.
IMPRESSION: Soft tissue swelling about the foot and ankle. No signs of acute
fracture or dislocation.
